# Patient Record
Sex: Female | Born: 1965 | Race: Black or African American | Hispanic: No | Marital: Married | Smoking: Never smoker
Health system: Southern US, Community
[De-identification: ages and names within clinical notes are randomized; demographics above are authoritative.]

## PROBLEM LIST (undated history)

## (undated) DIAGNOSIS — G43909 Migraine, unspecified, not intractable, without status migrainosus: Secondary | ICD-10-CM

## (undated) DIAGNOSIS — Z87442 Personal history of urinary calculi: Secondary | ICD-10-CM

## (undated) DIAGNOSIS — G971 Other reaction to spinal and lumbar puncture: Secondary | ICD-10-CM

## (undated) DIAGNOSIS — B019 Varicella without complication: Secondary | ICD-10-CM

## (undated) DIAGNOSIS — I10 Essential (primary) hypertension: Secondary | ICD-10-CM

## (undated) DIAGNOSIS — R011 Cardiac murmur, unspecified: Secondary | ICD-10-CM

## (undated) DIAGNOSIS — Z8489 Family history of other specified conditions: Secondary | ICD-10-CM

## (undated) HISTORY — PX: ENDOMETRIAL ABLATION: SHX621

## (undated) HISTORY — DX: Varicella without complication: B01.9

## (undated) HISTORY — PX: TUBAL LIGATION: SHX77

---

## 2004-02-01 DIAGNOSIS — Z87442 Personal history of urinary calculi: Secondary | ICD-10-CM

## 2004-02-01 HISTORY — DX: Personal history of urinary calculi: Z87.442

## 2004-02-26 ENCOUNTER — Emergency Department: Payer: Self-pay | Admitting: Emergency Medicine

## 2004-04-08 ENCOUNTER — Ambulatory Visit: Payer: Self-pay | Admitting: Specialist

## 2006-07-12 ENCOUNTER — Ambulatory Visit: Payer: Self-pay

## 2006-08-10 ENCOUNTER — Ambulatory Visit: Payer: Self-pay

## 2006-08-23 ENCOUNTER — Ambulatory Visit: Payer: Self-pay

## 2006-09-05 ENCOUNTER — Emergency Department: Payer: Self-pay | Admitting: Emergency Medicine

## 2007-10-11 ENCOUNTER — Ambulatory Visit: Payer: Self-pay

## 2008-01-13 IMAGING — CT CT STONE STUDY
1 of 2 series · 15 of 32 positions shown, 19 images · non-contrast
Comparison: none

REASON FOR EXAM: rlq pain, similar to kidney stone rm 5
COMMENTS:

[Series 2: stone · axial · 0.54mm/px · z∈[-836,-462]mm · 15 of 141 slices shown, 19 images]
[im 11/141  soft-tissue]
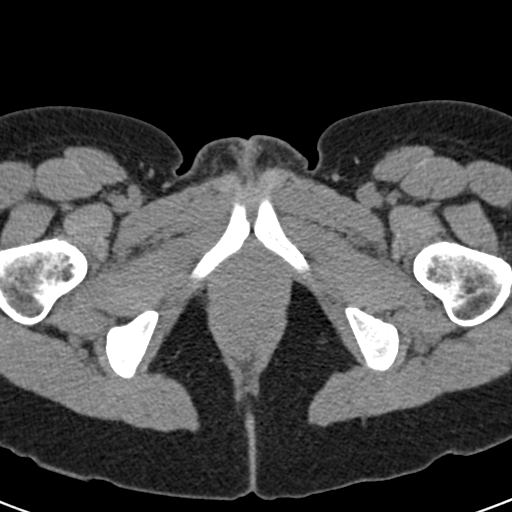
[im 11/141  bone]
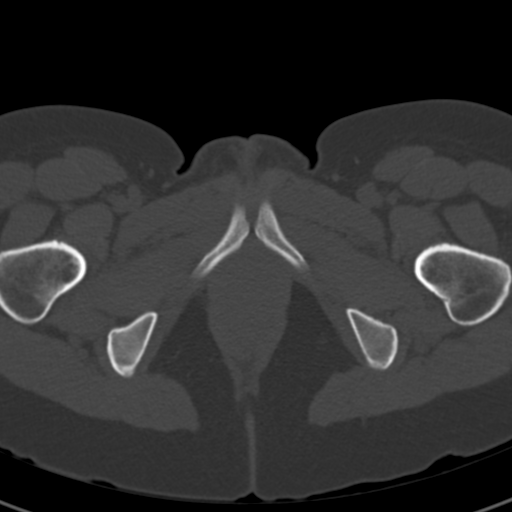
[im 21/141  soft-tissue]
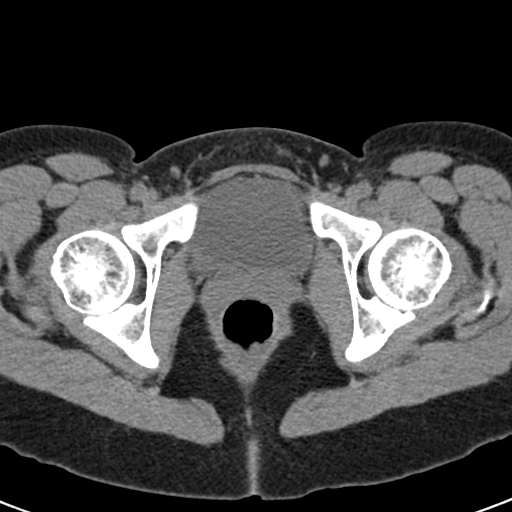
[im 31/141  soft-tissue]
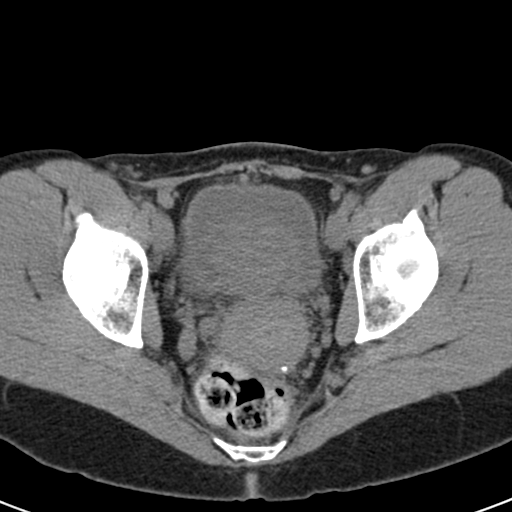
[im 41/141  soft-tissue]
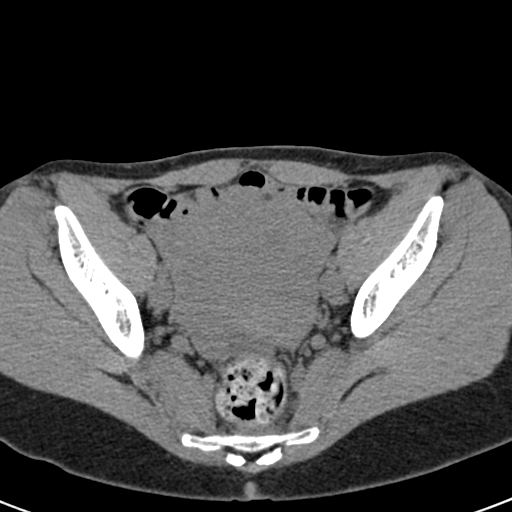
[im 51/141  soft-tissue]
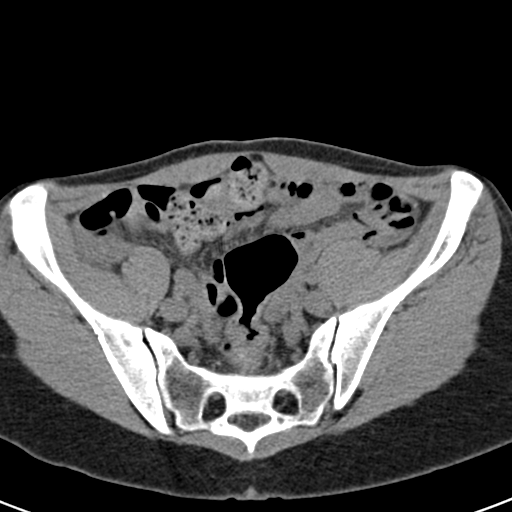
[im 61/141  soft-tissue]
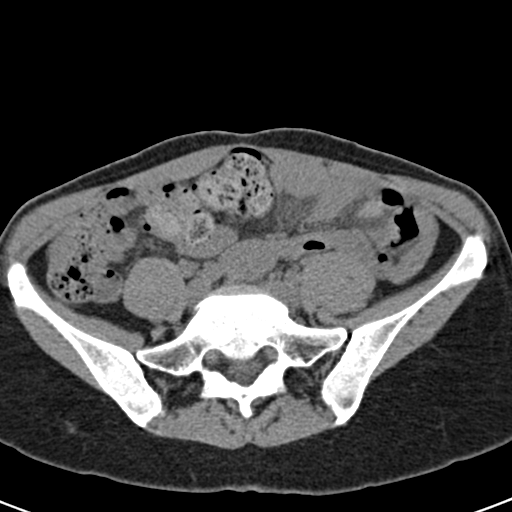
[im 71/141  soft-tissue]
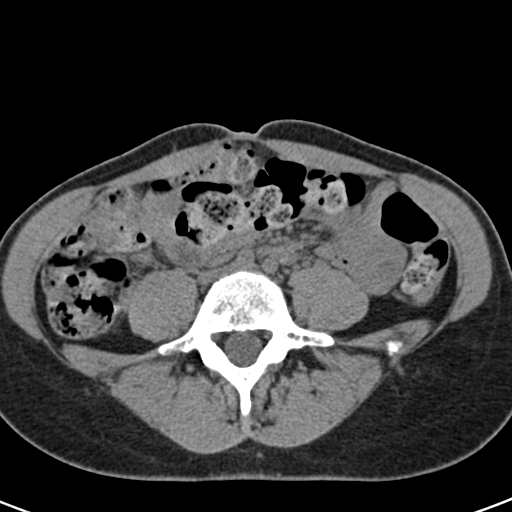
[im 81/141  soft-tissue]
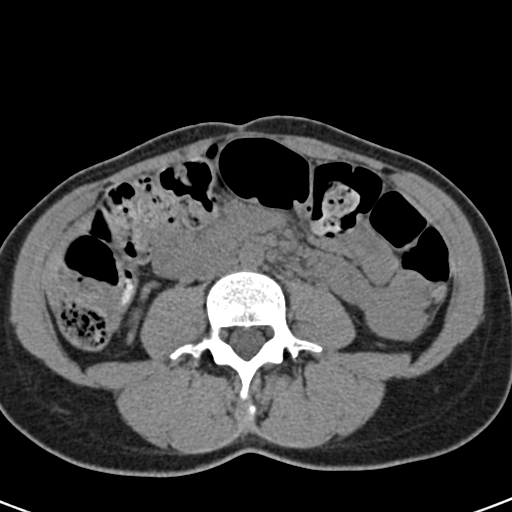
[im 91/141  soft-tissue]
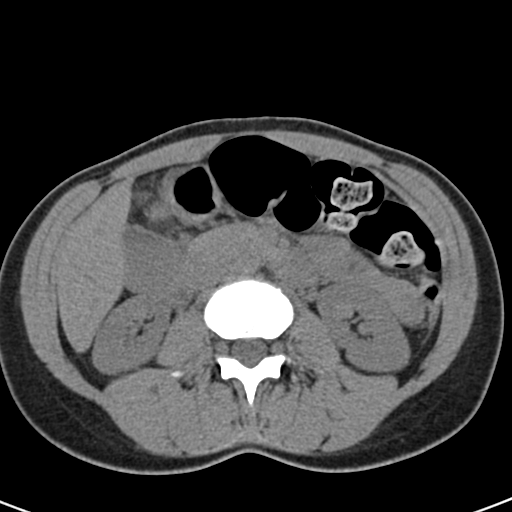
[im 91/141  bone]
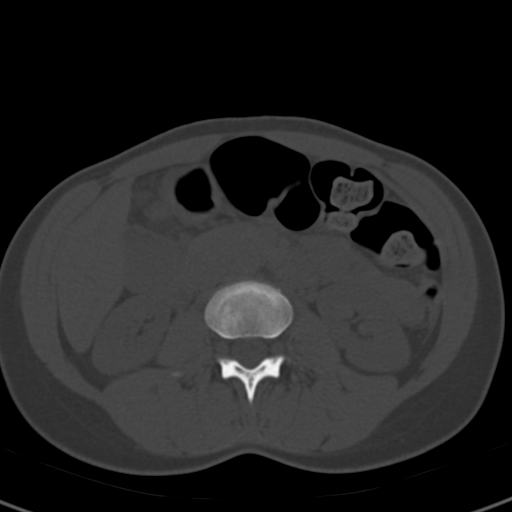
[im 101/141  soft-tissue]
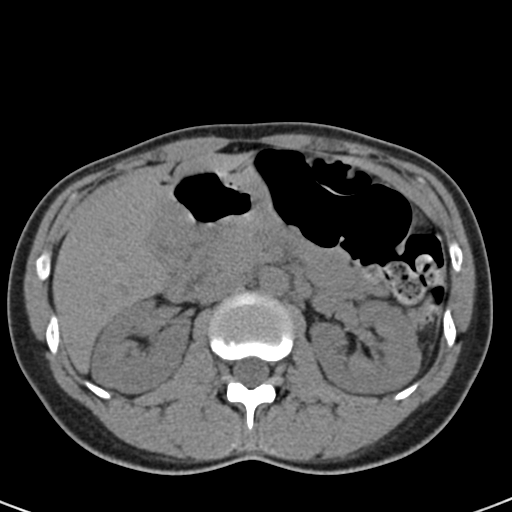
[im 111/141  soft-tissue]
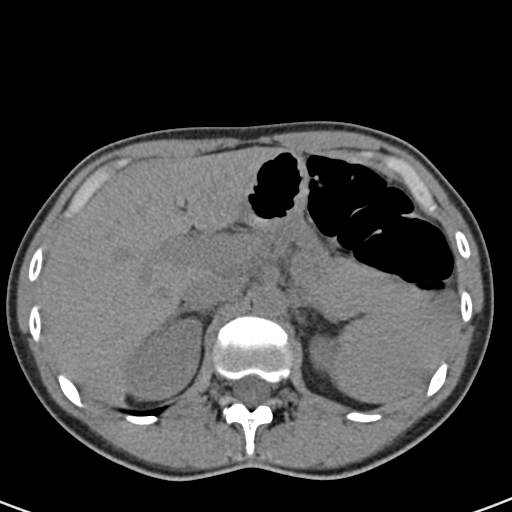
[im 121/141  soft-tissue]
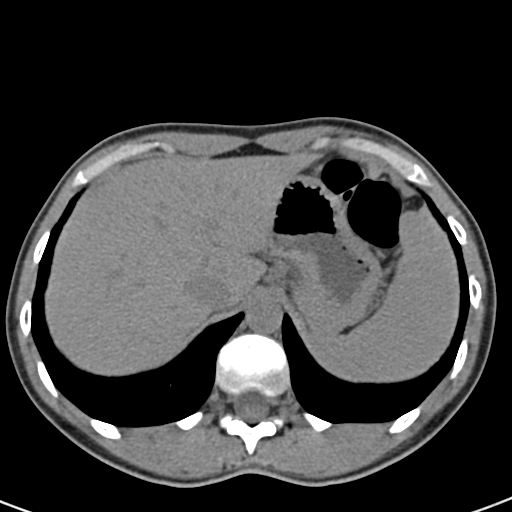
[im 121/141  lung]
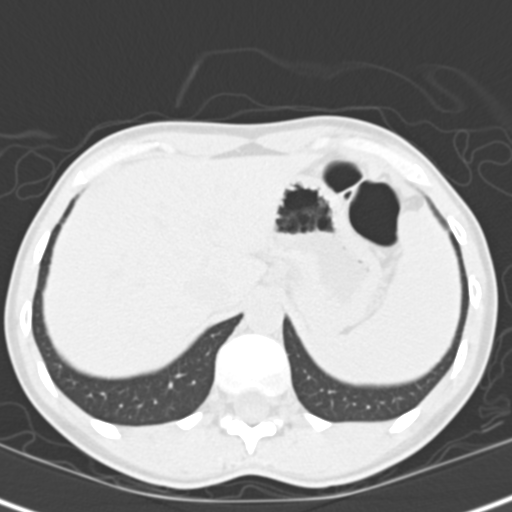
[im 126/141  lung]
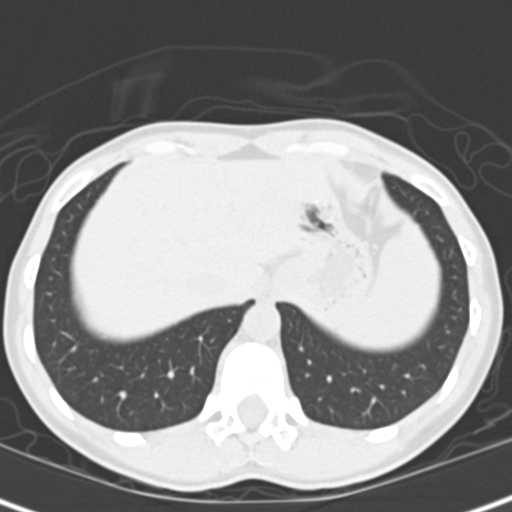
[im 131/141  soft-tissue]
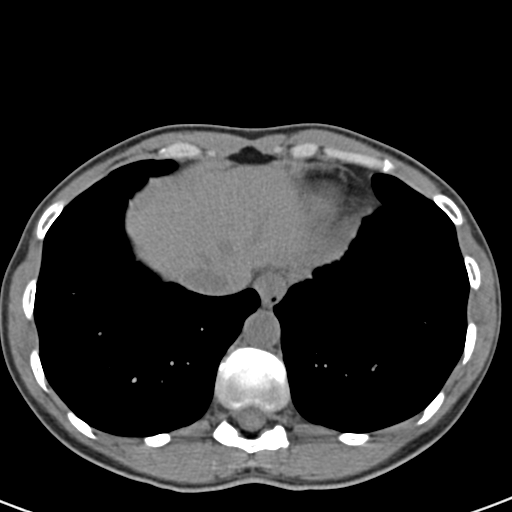
[im 131/141  lung]
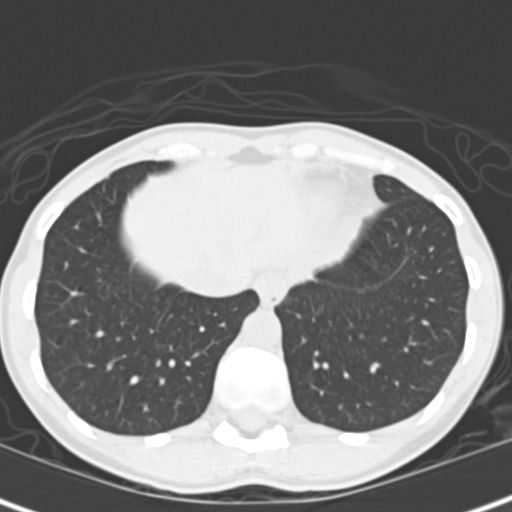
[im 136/141  lung]
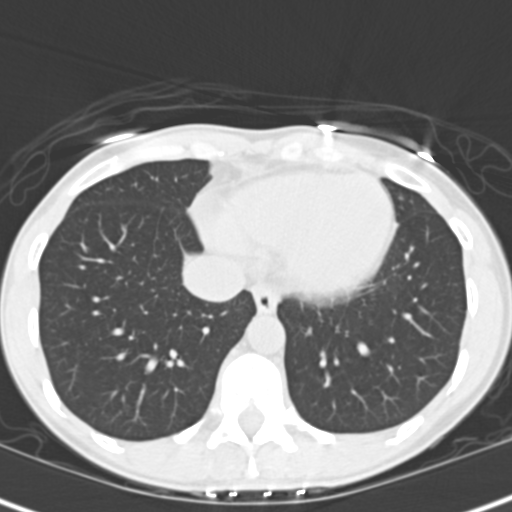

[15 of 32 positions shown; findings below may reference images not displayed]

PROCEDURE:     CT  - CT ABDOMEN /PELVIS WO (STONE)  - September 05, 2006  [DATE]

RESULT:     Noncontrast emergent CT of the abdomen and pelvis is performed.
The patient has no prior exam for comparison. The lung bases appear to be
normally aerated. There are noted focal calcific density changes in the
lower pole collecting system of the right kidney consistent with a lower
pole anterior calyx right renal stone best seen on image #53 and
demonstrating a diameter approximately 2.5 mm. There is no hydronephrosis.
No additional renal calculi are seen. There is low density laterally in the
midportion of the left kidney on image #40 which may represent a small cyst.
This can be further evaluated with ultrasound or triphasic CT.

Gallbladder appears to be distended. No radiopaque gallstones are evident.
No intrahepatic biliary ductal dilatation is evident.

The pancreas is poorly demonstrated but appears to be grossly normal. The
spleen is not enlarged. No oculi are evident within the ureters or bladder.
Phleboliths are seen in the pelvic region. The uterus appears to be mildly
enlarged. A small amount of cul-de-sac fluid present on the right. The
ovaries are not well demonstrated. There is no abnormal bowel distention.
There is no free air.
IMPRESSION: 1. No evidence of hydroureter or hydronephrosis. There is a nonobstructing
lower pole right renal stone.
2. Possible small amount of free fluid in the lower pelvic region in the
right side of the cul-de-sac.
3. The uterus appears to be mildly enlarged. Pelvic ultrasound can be
obtained for further investigation.
4. Ill-defined low density in the left mid kidney. Ultrasound of the kidneys
can be considered for further investigation as good a triphasic CT.

## 2008-05-01 ENCOUNTER — Ambulatory Visit: Payer: Self-pay | Admitting: Family Medicine

## 2008-06-25 DIAGNOSIS — I10 Essential (primary) hypertension: Secondary | ICD-10-CM | POA: Insufficient documentation

## 2008-06-25 DIAGNOSIS — D509 Iron deficiency anemia, unspecified: Secondary | ICD-10-CM | POA: Insufficient documentation

## 2008-09-15 DIAGNOSIS — B0089 Other herpesviral infection: Secondary | ICD-10-CM | POA: Insufficient documentation

## 2008-10-02 DIAGNOSIS — E7800 Pure hypercholesterolemia, unspecified: Secondary | ICD-10-CM | POA: Insufficient documentation

## 2008-10-21 ENCOUNTER — Ambulatory Visit: Payer: Self-pay | Admitting: Family Medicine

## 2009-03-27 DIAGNOSIS — R1013 Epigastric pain: Secondary | ICD-10-CM | POA: Insufficient documentation

## 2009-04-08 DIAGNOSIS — R519 Headache, unspecified: Secondary | ICD-10-CM | POA: Insufficient documentation

## 2009-04-11 ENCOUNTER — Emergency Department: Payer: Self-pay | Admitting: Emergency Medicine

## 2009-04-14 DIAGNOSIS — R5381 Other malaise: Secondary | ICD-10-CM | POA: Insufficient documentation

## 2009-04-15 DIAGNOSIS — E876 Hypokalemia: Secondary | ICD-10-CM | POA: Insufficient documentation

## 2009-09-08 IMAGING — CT CT HEAD WITHOUT AND WITH CONTRAST
1 of 2 series · 13 of 30 positions shown, 17 images · non-contrast
Comparison: none

REASON FOR EXAM: HA  dizziness  blood pressure high  eval tumor
COMMENTS:

[Series 2: without · axial · non-contrast · 0.43mm/px · z∈[-572,-452]mm · 13 of 30 slices shown, 17 images]
[im 3/30  brain]
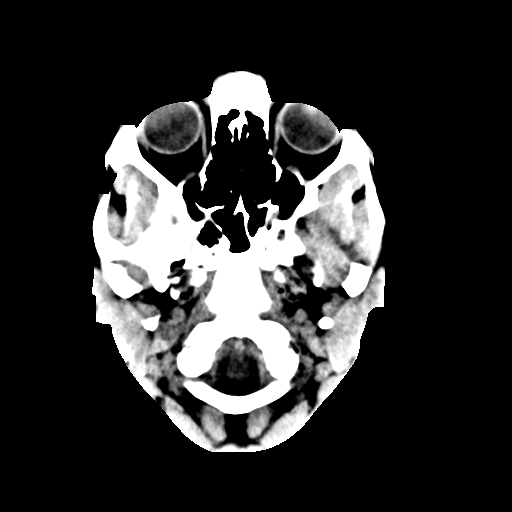
[im 3/30  bone]
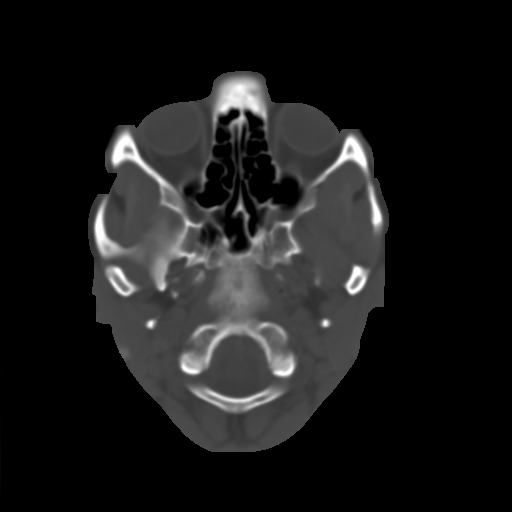
[im 5/30  brain]
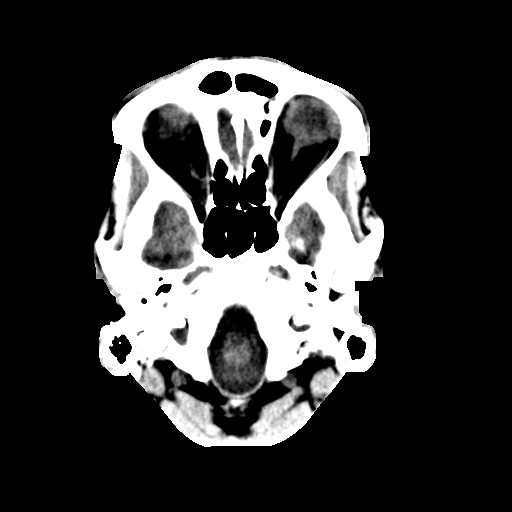
[im 7/30  brain]
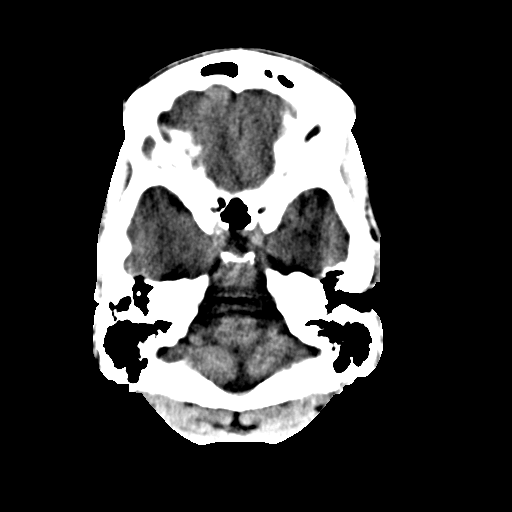
[im 9/30  brain]
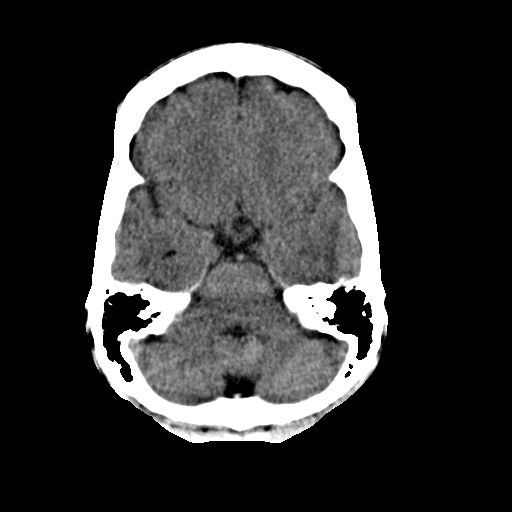
[im 11/30  brain]
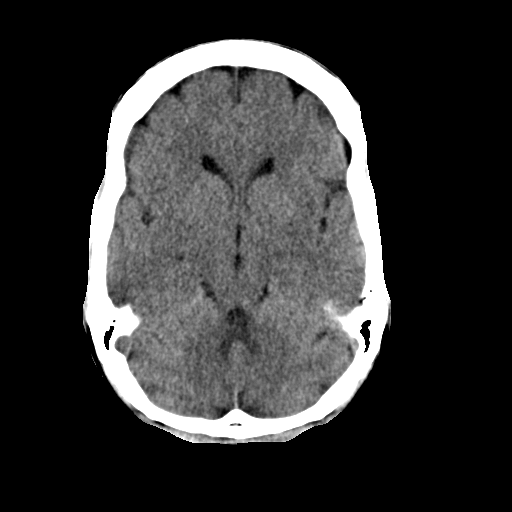
[im 11/30  bone]
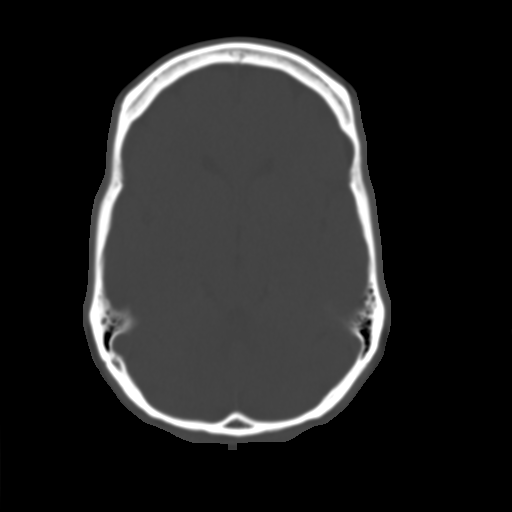
[im 13/30  brain]
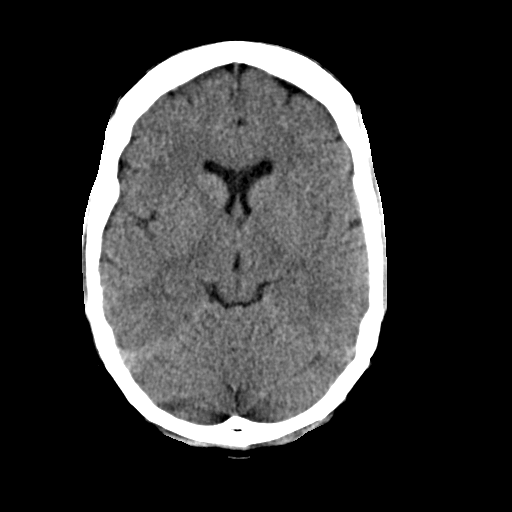
[im 15/30  brain]
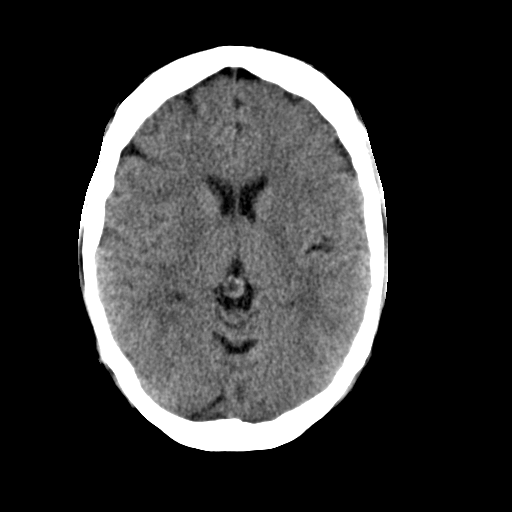
[im 17/30  brain]
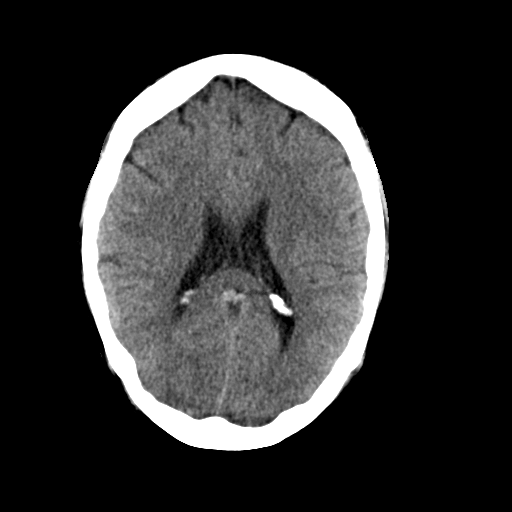
[im 19/30  brain]
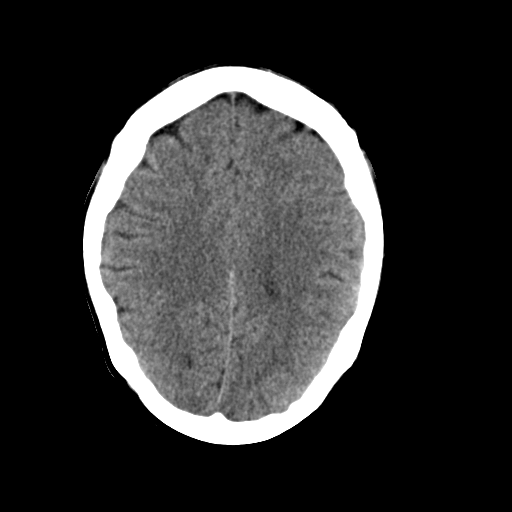
[im 19/30  bone]
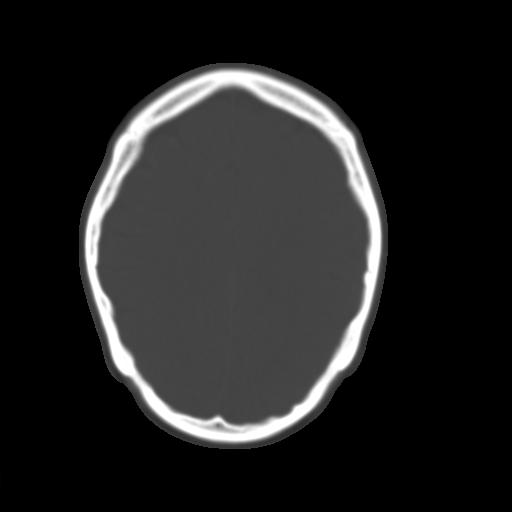
[im 21/30  brain]
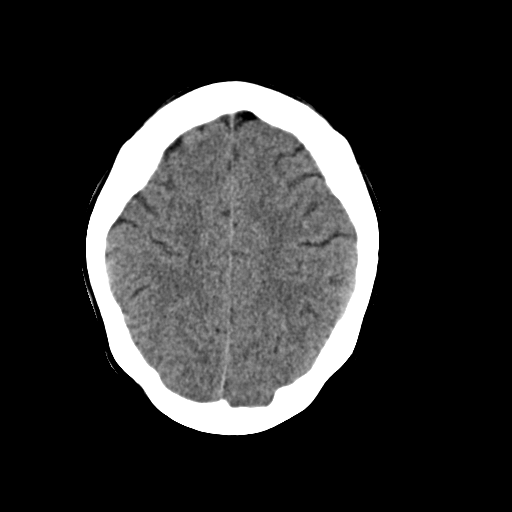
[im 23/30  brain]
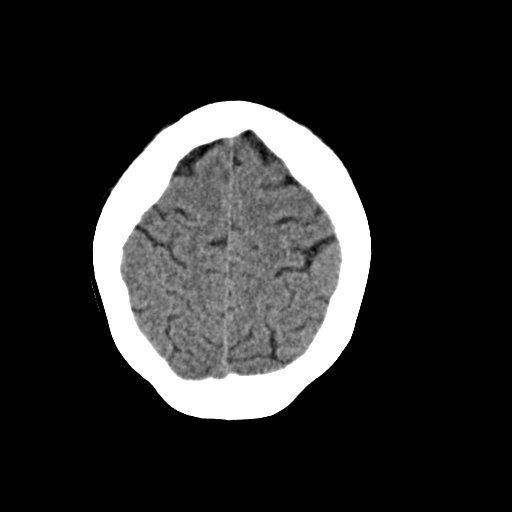
[im 25/30  brain]
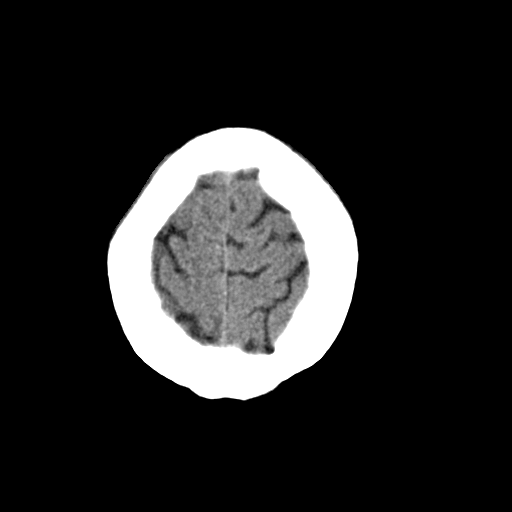
[im 27/30  brain]
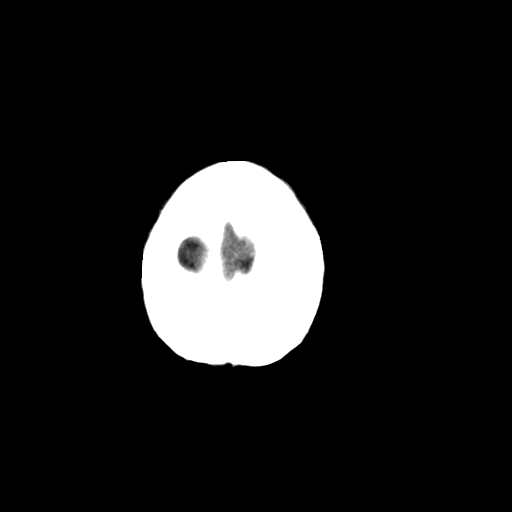
[im 27/30  bone]
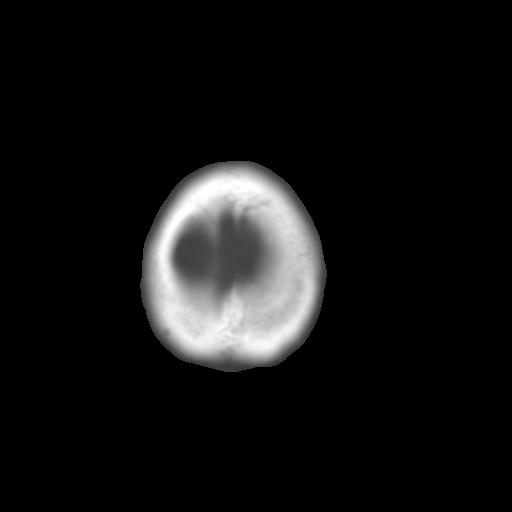

[13 of 30 positions shown; findings below may reference images not displayed]

PROCEDURE:     CT  - CT HEAD W/WO  - May 01, 2008  [DATE]

RESULT:     Axial noncontrast and contrast enhanced CT scanning was
performed through the brain at 5 mm intervals and slice thicknesses. The
ventricles are normal in size and position. There is no intracranial
hemorrhage nor evidence of intracranial mass effect. There are no findings
to suggest an acute evolving ischemic infarction.

Following contrast administration the enhancement pattern of the brain
parenchyma is normal. At bone window settings the observed portions of the
paranasal sinuses and mastoid air cells are clear.
IMPRESSION: I see no acute intracranial abnormality. If the patient's
symptoms persist and remain unexplained, followup MRI could be considered.

Of note is the fact that immediately following ingestion of IV contrast the
patient developed skin itching and macular erythematous areas over her chest
and back and lower neck. There was no difficulty breathing or swallowing.
The patient's mentation remain clear. The patient's vital signs were stable.
The patient received 25 mg of Benadryl IV. The patient was instructed to
return to the emergency department if she had additional symptoms to develop
immediately or in the next 24 hours. The patient voiced her willingness to
proceed. The patient was driven home by her son.

## 2009-11-11 ENCOUNTER — Ambulatory Visit: Payer: Self-pay | Admitting: Unknown Physician Specialty

## 2010-08-22 ENCOUNTER — Emergency Department: Payer: Self-pay | Admitting: Emergency Medicine

## 2010-12-20 ENCOUNTER — Ambulatory Visit: Payer: Self-pay | Admitting: Unknown Physician Specialty

## 2013-02-13 ENCOUNTER — Ambulatory Visit: Payer: Self-pay | Admitting: Otolaryngology

## 2013-04-02 ENCOUNTER — Ambulatory Visit: Payer: Self-pay | Admitting: Family Medicine

## 2013-10-24 ENCOUNTER — Ambulatory Visit: Payer: Self-pay | Admitting: Family Medicine

## 2014-07-07 ENCOUNTER — Other Ambulatory Visit: Payer: Self-pay | Admitting: Family Medicine

## 2014-07-07 ENCOUNTER — Telehealth: Payer: Self-pay | Admitting: Family Medicine

## 2014-07-07 DIAGNOSIS — S39012A Strain of muscle, fascia and tendon of lower back, initial encounter: Secondary | ICD-10-CM

## 2014-07-07 MED ORDER — CYCLOBENZAPRINE HCL 5 MG PO TABS: 5.0000 mg | ORAL_TABLET | Freq: Three times a day (TID) | ORAL | Status: DC | PRN

## 2014-07-07 MED ORDER — MELOXICAM 15 MG PO TABS: 15.0000 mg | ORAL_TABLET | Freq: Every day | ORAL | Status: DC

## 2014-07-07 MED ORDER — MELOXICAM 15 MG PO TABS
15.0000 mg | ORAL_TABLET | Freq: Every day | ORAL | Status: DC
Start: 2014-07-07 — End: 2014-07-07

## 2014-07-07 NOTE — Telephone Encounter (Signed)
Pt stated her right shoulder has been hurting since Wednesday 07/02/14 and wanted to speak with you about coming in for OV or getting a referral. I advised pt I could get her in with a PA today or you on Friday. Pt stated that she would like to speak with you and get your advise. Thanks TNP

## 2014-07-07 NOTE — Telephone Encounter (Signed)
Spoke with patient. Is having back pain.  Is taking daily medication, icy hot and heat.  Continual pain in shoulder area, right upper back.   No acute trauma that she know of.  Was out of town last week.  Will send in rx to pharmacy.

## 2014-09-25 ENCOUNTER — Other Ambulatory Visit: Payer: Self-pay | Admitting: Family Medicine

## 2014-09-25 DIAGNOSIS — E559 Vitamin D deficiency, unspecified: Secondary | ICD-10-CM

## 2014-09-25 NOTE — Telephone Encounter (Signed)
Last OV 06/2014  Thanks,   -Laura  

## 2014-09-26 ENCOUNTER — Encounter: Payer: Self-pay | Admitting: *Deleted

## 2014-09-26 ENCOUNTER — Emergency Department
Admission: EM | Admit: 2014-09-26 | Discharge: 2014-09-26 | Disposition: A | Discharge status: Home / Self Care | Payer: BLUE CROSS/BLUE SHIELD | Attending: Emergency Medicine | Admitting: Emergency Medicine

## 2014-09-26 DIAGNOSIS — Z79899 Other long term (current) drug therapy: Secondary | ICD-10-CM | POA: Insufficient documentation

## 2014-09-26 DIAGNOSIS — I10 Essential (primary) hypertension: Secondary | ICD-10-CM | POA: Insufficient documentation

## 2014-09-26 DIAGNOSIS — G43009 Migraine without aura, not intractable, without status migrainosus: Secondary | ICD-10-CM

## 2014-09-26 DIAGNOSIS — G43909 Migraine, unspecified, not intractable, without status migrainosus: Secondary | ICD-10-CM | POA: Diagnosis present

## 2014-09-26 DIAGNOSIS — Z791 Long term (current) use of non-steroidal anti-inflammatories (NSAID): Secondary | ICD-10-CM | POA: Insufficient documentation

## 2014-09-26 HISTORY — DX: Essential (primary) hypertension: I10

## 2014-09-26 HISTORY — DX: Migraine, unspecified, not intractable, without status migrainosus: G43.909

## 2014-09-26 MED ORDER — DEXAMETHASONE SODIUM PHOSPHATE 10 MG/ML IJ SOLN
10.0000 mg | Freq: Once | INTRAMUSCULAR | Status: AC
  Administered 2014-09-26: 10 mg via INTRAVENOUS
  Filled 2014-09-26: qty 1

## 2014-09-26 MED ORDER — DIPHENHYDRAMINE HCL 50 MG/ML IJ SOLN
25.0000 mg | Freq: Once | INTRAMUSCULAR | Status: AC
  Administered 2014-09-26: 25 mg via INTRAVENOUS
  Filled 2014-09-26: qty 1

## 2014-09-26 MED ORDER — METOCLOPRAMIDE HCL 5 MG/ML IJ SOLN
10.0000 mg | Freq: Once | INTRAMUSCULAR | Status: AC
  Administered 2014-09-26: 10 mg via INTRAVENOUS
  Filled 2014-09-26: qty 2

## 2014-09-26 MED ORDER — KETOROLAC TROMETHAMINE 30 MG/ML IJ SOLN
30.0000 mg | Freq: Once | INTRAMUSCULAR | Status: AC
Start: 2014-09-26 — End: 2014-09-26
  Administered 2014-09-26: 30 mg via INTRAVENOUS
  Filled 2014-09-26: qty 1

## 2014-09-26 MED ORDER — SODIUM CHLORIDE 0.9 % IV BOLUS (SEPSIS)
500.0000 mL | INTRAVENOUS | Status: AC
  Administered 2014-09-26: 500 mL via INTRAVENOUS

## 2014-09-26 NOTE — Discharge Instructions (Signed)
You have been seen in the Emergency Department (ED) for a migraine.  Please use Tylenol or Motrin as needed for symptoms, but only as written on the box, and take any regular medications that have been prescribed for you. °As we have discussed, please follow up with your doctor as soon as possible regarding today’s ED visit and your headache symptoms.   ° °Call your doctor or return to the Emergency Department (ED) if you have a worsening headache, sudden and severe headache, confusion, slurred speech, facial droop, weakness or numbness in any arm or leg, extreme fatigue, or other symptoms that concern you. ° ° °Migraine Headache °A migraine headache is an intense, throbbing pain on one or both sides of your head. A migraine can last for 30 minutes to several hours. °CAUSES  °The exact cause of a migraine headache is not always known. However, a migraine may be caused when nerves in the brain become irritated and release chemicals that cause inflammation. This causes pain. °Certain things may also trigger migraines, such as: °· Alcohol. °· Smoking. °· Stress. °· Menstruation. °· Aged cheeses. °· Foods or drinks that contain nitrates, glutamate, aspartame, or tyramine. °· Lack of sleep. °· Chocolate. °· Caffeine. °· Hunger. °· Physical exertion. °· Fatigue. °· Medicines used to treat chest pain (nitroglycerine), birth control pills, estrogen, and some blood pressure medicines. °SIGNS AND SYMPTOMS °· Pain on one or both sides of your head. °· Pulsating or throbbing pain. °· Severe pain that prevents daily activities. °· Pain that is aggravated by any physical activity. °· Nausea, vomiting, or both. °· Dizziness. °· Pain with exposure to bright lights, loud noises, or activity. °· General sensitivity to bright lights, loud noises, or smells. °Before you get a migraine, you may get warning signs that a migraine is coming (aura). An aura may include: °· Seeing flashing lights. °· Seeing bright spots, halos, or zigzag  lines. °· Having tunnel vision or blurred vision. °· Having feelings of numbness or tingling. °· Having trouble talking. °· Having muscle weakness. °DIAGNOSIS  °A migraine headache is often diagnosed based on: °· Symptoms. °· Physical exam. °· A CT scan or MRI of your head. These imaging tests cannot diagnose migraines, but they can help rule out other causes of headaches. °TREATMENT °Medicines may be given for pain and nausea. Medicines can also be given to help prevent recurrent migraines.  °HOME CARE INSTRUCTIONS °· Only take over-the-counter or prescription medicines for pain or discomfort as directed by your health care provider. The use of long-term narcotics is not recommended. °· Lie down in a dark, quiet room when you have a migraine. °· Keep a journal to find out what may trigger your migraine headaches. For example, write down: °¨ What you eat and drink. °¨ How much sleep you get. °¨ Any change to your diet or medicines. °· Limit alcohol consumption. °· Quit smoking if you smoke. °· Get 7-9 hours of sleep, or as recommended by your health care provider. °· Limit stress. °· Keep lights dim if bright lights bother you and make your migraines worse. °SEEK IMMEDIATE MEDICAL CARE IF:  °· Your migraine becomes severe. °· You have a fever. °· You have a stiff neck. °· You have vision loss. °· You have muscular weakness or loss of muscle control. °· You start losing your balance or have trouble walking. °· You feel faint or pass out. °· You have severe symptoms that are different from your first symptoms. °MAKE SURE YOU:  °·   Understand these instructions. °· Will watch your condition. °· Will get help right away if you are not doing well or get worse. °Document Released: 01/17/2005 Document Revised: 06/03/2013 Document Reviewed: 09/24/2012 °[employer]® Patient Information ©2015 [employer]. This information is not intended to replace advice given to you by your health care provider. Make sure you discuss any  questions you have with your health care provider. ° °

## 2014-09-26 NOTE — ED Provider Notes (Signed)
[facility] Emergency Department Provider Note  ____________________________________________  Time seen: Approximately 9:55 PM  I have reviewed the triage vital signs and the nursing notes.   HISTORY  Chief Complaint Migraine    HPI Emily Hurst is a 49 y.o. female with a history of migraine and hypertension but he does not take chronic medication for either presents with 3 days of headache.  She states it feels similar to her prior migraines but is lasting longer than usual.  It was gradual in onset but has been persistent for 3 days.  Her migraines been well-controlled for years so this is somewhat atypical.  She does not take medications such as Imitrex or other migraine medicine, but usually Excedrin works for headaches.  It is not been working for the last couple of days.  She has been having nausea, no vomiting, no visual changes, no chest pain, no shortness of breath, no abdominal pain, no dysuria.   Past Medical History  Diagnosis Date  . Migraine   . Hypertension     Patient Active Problem List   Diagnosis Date Noted  . Vitamin D deficiency 09/25/2014    Past Surgical History  Procedure Laterality Date  . Cesarean section    . Endometrial ablation    . Tubal ligation      Current Outpatient Rx  Name  Route  Sig  Dispense  Refill  . Vitamin D, Ergocalciferol, (DRISDOL) 50000 UNITS CAPS capsule   Oral   Take 1 capsule (50,000 Units total) by mouth every 30 (thirty) days.   3 capsule   3   . cyclobenzaprine (FLEXERIL) 5 MG tablet   Oral   Take 1 tablet (5 mg total) by mouth 3 (three) times daily as needed for muscle spasms.   30 tablet   0   . meloxicam (MOBIC) 15 MG tablet   Oral   Take 1 tablet (15 mg total) by mouth daily.   30 tablet   0     Allergies Biaxin; Contrast media; and Percocet  History reviewed. No pertinent family history.  Social History Social History  Substance Use Topics  . Smoking status: Never  Smoker   . Smokeless tobacco: None  . Alcohol Use: No    Review of Systems Constitutional: No fever/chills Eyes: No visual changes. ENT: No sore throat. Cardiovascular: Denies chest pain. Respiratory: Denies shortness of breath. Gastrointestinal: No abdominal pain.  nausea, no vomiting.  No diarrhea.  No constipation. Genitourinary: Negative for dysuria. Musculoskeletal: Negative for back pain. Skin: Negative for rash. Neurological: Severe headache mostly on the top of her head, no focal weakness or numbness.  10-point ROS otherwise negative.  ____________________________________________   PHYSICAL EXAM:  VITAL SIGNS: ED Triage Vitals  Enc Vitals Group     BP 09/26/14 2117 170/100 mmHg     Pulse Rate 09/26/14 2117 102     Resp 09/26/14 2117 18     Temp 09/26/14 2117 98.8 F (37.1 C)     Temp Source 09/26/14 2117 Oral     SpO2 09/26/14 2117 100 %     Weight 09/26/14 2117 128 lb (58.06 kg)     Height 09/26/14 2117 5' 6.5" (1.689 m)     Head Cir --      Peak Flow --      Pain Score 09/26/14 2118 8     Pain Loc --      Pain Edu? --      Excl. in GC? --  Constitutional: Alert and oriented. Well appearing and in no acute distress.  Appears uncomfortable but definitely nontoxic Eyes: Conjunctivae are normal. PERRL. EOMI. no papilledema on funduscopic exam Head: Atraumatic. Nose: No congestion/rhinnorhea. Mouth/Throat: Mucous membranes are moist.  Oropharynx non-erythematous. Neck: No stridor.   Cardiovascular: Normal rate, regular rhythm. Grossly normal heart sounds.  Good peripheral circulation. Respiratory: Normal respiratory effort.  No retractions. Lungs CTAB. Gastrointestinal: Soft and nontender. No distention. No abdominal bruits. No CVA tenderness. Musculoskeletal: No lower extremity tenderness nor edema.  No joint effusions. Neurologic:  Normal speech and language. No gross focal neurologic deficits are appreciated.  Skin:  Skin is warm, dry and intact. No  rash noted. Psychiatric: Mood and affect are normal. Speech and behavior are normal.  ____________________________________________   LABS (all labs ordered are listed, but only abnormal results are displayed)  Not indicated ____________________________________________  EKG  Not indicated ____________________________________________  RADIOLOGY  Not indicated  ____________________________________________   PROCEDURES  Procedure(s) performed: None  Critical Care performed: No ____________________________________________   INITIAL IMPRESSION / ASSESSMENT AND PLAN / ED COURSE  Pertinent labs & imaging results that were available during my care of the patient were reviewed by me and considered in my medical decision making (see chart for details).  The patient is well-appearing even though she obviously is uncomfortable from her headache.  I have no concern for papilledema based on her reassuring funduscopic exam, her history, and her demographics and body habitus.  Intracranial abnormality such as an aneurysm is possible but highly unlikely given the gradual onset of her pain, its persistence, and lack of any other supporting symptoms.  I will treat her empirically for her migraine and anticipate this will significantly relieve her discomfort.  She has a primary care doctor with whom she can follow up early next week.  I am giving her my usual cocktail of 500 mL normal saline bolus, Toradol 30 mg IV, Reglan 10 mg IV, Benadryl 25 mg IV, and Decadron 10 mg IV to help prevent recurrence.  ----------------------------------------- 11:01 PM on 09/26/2014 -----------------------------------------  The patient is receiving treatment now.  I am transferring care to Dr. Manson Passey to reassess her after treatment.  ____________________________________________  FINAL CLINICAL IMPRESSION(S) / ED DIAGNOSES  Final diagnoses:  Migraine without aura and without status migrainosus, not  intractable      NEW MEDICATIONS STARTED DURING THIS VISIT:  New Prescriptions   No medications on file     Loleta Rose, MD 09/26/14 2301

## 2014-09-26 NOTE — ED Notes (Signed)
Pt reports HA since Monday, feels similar to pt's previous migraines.  PT reports throbbing to entire head, nausea, and sensitivity to light.  Pt reports elevated BP but not currently on BP meds, pt reports not on meds b/c bp fluctuates.  Pt NAD at this time.

## 2014-09-26 NOTE — ED Notes (Signed)
Pt admits to migraine HA that began on Monday - pt has taken Excedrin w/o relief. Pt also admits to nausea, denies vomiting, as well as some photophobia.

## 2014-09-27 NOTE — ED Provider Notes (Signed)
I assumed care of the patient 11:00 PM from Dr. York Cerise. Patient states that headache is completely resolved following Dr. York Cerise. As such patient was discharged home  Darci Current, MD 09/27/14 [phone]

## 2014-09-29 ENCOUNTER — Telehealth: Payer: Self-pay | Admitting: Family Medicine

## 2014-09-29 ENCOUNTER — Ambulatory Visit (INDEPENDENT_AMBULATORY_CARE_PROVIDER_SITE_OTHER): Payer: BLUE CROSS/BLUE SHIELD | Admitting: Family Medicine

## 2014-09-29 ENCOUNTER — Encounter: Payer: Self-pay | Admitting: Family Medicine

## 2014-09-29 VITALS — BP 140/100 | HR 72 | Temp 98.7°F | Resp 16 | Ht 65.5 in | Wt 128.0 lb

## 2014-09-29 DIAGNOSIS — I10 Essential (primary) hypertension: Secondary | ICD-10-CM | POA: Diagnosis not present

## 2014-09-29 DIAGNOSIS — G43909 Migraine, unspecified, not intractable, without status migrainosus: Secondary | ICD-10-CM | POA: Diagnosis not present

## 2014-09-29 DIAGNOSIS — E28319 Asymptomatic premature menopause: Secondary | ICD-10-CM | POA: Insufficient documentation

## 2014-09-29 DIAGNOSIS — R000 Tachycardia, unspecified: Secondary | ICD-10-CM | POA: Insufficient documentation

## 2014-09-29 DIAGNOSIS — E860 Dehydration: Secondary | ICD-10-CM | POA: Insufficient documentation

## 2014-09-29 DIAGNOSIS — R1031 Right lower quadrant pain: Secondary | ICD-10-CM | POA: Insufficient documentation

## 2014-09-29 DIAGNOSIS — R5383 Other fatigue: Secondary | ICD-10-CM | POA: Insufficient documentation

## 2014-09-29 DIAGNOSIS — N920 Excessive and frequent menstruation with regular cycle: Secondary | ICD-10-CM | POA: Insufficient documentation

## 2014-09-29 MED ORDER — HYDROCHLOROTHIAZIDE 12.5 MG PO TABS: 12.5000 mg | ORAL_TABLET | Freq: Every day | ORAL | Status: DC

## 2014-09-29 NOTE — Telephone Encounter (Signed)
Pt was discharged from [facility] ER 09/26/2014 for headache, being light headed and high blood pressure.  I have schedule pt today for a follow up/MW

## 2014-09-29 NOTE — Progress Notes (Signed)
Patient ID: Emily Hurst, female   DOB: December 12, 1965, 49 y.o.   MRN: [id number]       [name] Female    DOB: 1965/08/08   49 y.o.   MRN: [id number] Visit Date: 09/29/2014  Today's Provider: Lorie Phenix, MD   Chief Complaint  Patient presents with  . Hospitalization Follow-up  . Hypertension   Subjective:    HPI  ER Follow up  Patient was seen at [facility] ER on Friday 09/26/2014. She was treated for headache and elevated BP. Per pt BP188/106 at arrival and 148/96 at discharge. Treatment for this included migraine cocktail at ER. She reports good tolerance of treatment. She reports that headache has Improved. Patient reports that she is still feeling some lightheaded, tired and poor appetite. Patient reports that she started with headache on Monday 09/22/2014, pt took Excedrin as needed.    Hypertension, follow-up:    She was last seen for hypertension 3 months ago.  BP at that visit was 138/98. Management changes since that visit include none. She reports good compliance with treatment. She is exercising. She is adherent to low salt diet.   Outside blood pressures are 138/106 yesterday at [facility]. She is experiencing fatigue.  Patient denies chest pain.   Cardiovascular risk factors include none.  Use of agents associated with hypertension: none.      Patient reports that she has been checking her BP at [facility] every other week since May, 2016 Patient reports average 127/86. That is about the average. Yesterday 138/106.   Weight trend: stable Wt Readings from Last 3 Encounters:  09/29/14 128 lb (58.06 kg)  09/26/14 128 lb (58.06 kg)    Current diet: well balanced       Allergies  Allergen Reactions  . Biaxin [Clarithromycin]   . Contrast Media [Iodinated Diagnostic Agents]   . Percocet [Oxycodone-Acetaminophen]    Previous Medications   ASPIRIN-ACETAMINOPHEN-CAFFEINE (EXCEDRIN MIGRAINE) 250-250-65 MG PER TABLET    Take 1 tablet by mouth. AS  NEEDED   CYCLOBENZAPRINE (FLEXERIL) 5 MG TABLET    Take 1 tablet (5 mg total) by mouth 3 (three) times daily as needed for muscle spasms.   MULTIPLE VITAMINS-MINERALS (WOMENS MULTIVITAMIN PLUS) TABS    Take 1 tablet by mouth daily.   VITAMIN D, ERGOCALCIFEROL, (DRISDOL) 50000 UNITS CAPS CAPSULE    Take 1 capsule (50,000 Units total) by mouth every 30 (thirty) days.    Review of Systems  Constitutional: Positive for activity change and fatigue.  Neurological: Positive for light-headedness and headaches.    Social History  Substance Use Topics  . Smoking status: Never Smoker   . Smokeless tobacco: Never Used  . Alcohol Use: No   Objective:   BP 140/100 mmHg  Pulse 72  Temp(Src) 98.7 F (37.1 C) (Oral)  Resp 16  Ht 5' 5.5" (1.664 m)  Wt 128 lb (58.06 kg)  BMI 20.97 kg/m2  SpO2 98%  Physical Exam  Constitutional: She is oriented to person, place, and time. She appears well-developed and well-nourished.  Cardiovascular: Normal rate and regular rhythm.   Pulmonary/Chest: Effort normal and breath sounds normal.  Neurological: She is alert and oriented to person, place, and time.  Psychiatric: She has a normal mood and affect. Her behavior is normal. Thought content normal.      Assessment & Plan:     1. Essential (primary) hypertension Condition is worsening. Will start medication for better control.  Recheck in 4 to 6 weeks.  - CBC  with Differential/Platelet - Comprehensive metabolic panel - hydrochlorothiazide (HYDRODIURIL) 12.5 MG tablet; Take 1 tablet (12.5 mg total) by mouth daily.  Dispense: 90 tablet; Refill: 3  2. Migraine without status migrainosus, not intractable, unspecified migraine type Will consider prophylaxis if worsens.      Lorie Phenix, MD  [facility]   ------------------------------------------------------------------------------------

## 2014-09-30 ENCOUNTER — Telehealth: Payer: Self-pay

## 2014-09-30 LAB — CBC WITH DIFFERENTIAL/PLATELET
Basophils Absolute: 0 10*3/uL (ref 0.0–0.2)
Basos: 1 %
EOS (ABSOLUTE): 0.1 10*3/uL (ref 0.0–0.4)
EOS: 1 %
Hematocrit: 42.6 % (ref 34.0–46.6)
Hemoglobin: 14.8 g/dL (ref 11.1–15.9)
IMMATURE GRANULOCYTES: 0 %
Immature Grans (Abs): 0 10*3/uL (ref 0.0–0.1)
Lymphocytes Absolute: 1.7 10*3/uL (ref 0.7–3.1)
Lymphs: 24 %
MCH: 32.2 pg (ref 26.6–33.0)
MCHC: 34.7 g/dL (ref 31.5–35.7)
MCV: 93 fL (ref 79–97)
MONOS ABS: 0.4 10*3/uL (ref 0.1–0.9)
Monocytes: 6 %
NEUTROS PCT: 68 %
Neutrophils Absolute: 4.8 10*3/uL (ref 1.4–7.0)
PLATELETS: 291 10*3/uL (ref 150–379)
RBC: 4.6 x10E6/uL (ref 3.77–5.28)
RDW: 13.6 % (ref 12.3–15.4)
WBC: 7 10*3/uL (ref 3.4–10.8)

## 2014-09-30 LAB — COMPREHENSIVE METABOLIC PANEL
ALT: 12 IU/L (ref 0–32)
AST: 21 IU/L (ref 0–40)
Albumin/Globulin Ratio: 1.6 (ref 1.1–2.5)
Albumin: 5 g/dL (ref 3.5–5.5)
Alkaline Phosphatase: 65 IU/L (ref 39–117)
BUN/Creatinine Ratio: 18 (ref 9–23)
BUN: 18 mg/dL (ref 6–24)
Bilirubin Total: 0.8 mg/dL (ref 0.0–1.2)
CALCIUM: 9.7 mg/dL (ref 8.7–10.2)
CO2: 19 mmol/L (ref 18–29)
CREATININE: 1.01 mg/dL — AB (ref 0.57–1.00)
Chloride: 98 mmol/L (ref 97–108)
GFR calc Af Amer: 76 mL/min/{1.73_m2} (ref >59)
GFR, EST NON AFRICAN AMERICAN: 66 mL/min/{1.73_m2} (ref >59)
GLUCOSE: 93 mg/dL (ref 65–99)
Globulin, Total: 3.1 g/dL (ref 1.5–4.5)
Potassium: 4.3 mmol/L (ref 3.5–5.2)
Sodium: 140 mmol/L (ref 134–144)
Total Protein: 8.1 g/dL (ref 6.0–8.5)

## 2014-09-30 NOTE — Telephone Encounter (Signed)
Pt advised.   Thanks,   -Connor Foxworthy  

## 2014-09-30 NOTE — Telephone Encounter (Signed)
-----   Message from Lorie Phenix, MD sent at 09/30/2014  8:27 AM EDT ----- Labs are all within normal limits.  Thanks.

## 2014-10-28 ENCOUNTER — Ambulatory Visit (INDEPENDENT_AMBULATORY_CARE_PROVIDER_SITE_OTHER): Payer: BLUE CROSS/BLUE SHIELD | Admitting: Family Medicine

## 2014-10-28 ENCOUNTER — Encounter: Payer: Self-pay | Admitting: Family Medicine

## 2014-10-28 VITALS — BP 120/86 | HR 76 | Temp 98.7°F | Resp 16 | Ht 66.5 in | Wt 129.0 lb

## 2014-10-28 DIAGNOSIS — Z1231 Encounter for screening mammogram for malignant neoplasm of breast: Secondary | ICD-10-CM

## 2014-10-28 DIAGNOSIS — G43909 Migraine, unspecified, not intractable, without status migrainosus: Secondary | ICD-10-CM

## 2014-10-28 DIAGNOSIS — R030 Elevated blood-pressure reading, without diagnosis of hypertension: Secondary | ICD-10-CM | POA: Diagnosis not present

## 2014-10-28 NOTE — Progress Notes (Signed)
Patient ID: Emily Hurst, female   DOB: [birth year], 49 y.o.   MRN: [id number]        [name] Female    DOB: October 27, 1965   49 y.o.   MRN: [id number] Visit Date: 10/28/2014  Today's Provider: Lorie Phenix, MD   Chief Complaint  Patient presents with  . Hypertension  . Headache   Subjective:    HPI   Hypertension, follow-up:  Patient reports that she only took 2 days of HCTZ due to low blood pressure readings. Is not taking it now.    BP Readings from Last 3 Encounters:  10/28/14 120/86  09/29/14 140/100  09/26/14 144/98    She was last seen for hypertension 4 weeks ago.  BP at that visit was 140/100. Management changes since that visit include none. She reports fair compliance with treatment. She is having side effects. Low blood pressure, pt reports BP 113/75  She is not exercising. She is adherent to low salt diet.   Outside blood pressures are 112-143/871-95. She is experiencing none.  Patient denies chest pain.   Cardiovascular risk factors include none.  Use of agents associated with hypertension: NSAIDS.     Weight trend: stable Wt Readings from Last 3 Encounters:  10/28/14 129 lb (58.514 kg)  09/29/14 128 lb (58.06 kg)  09/26/14 128 lb (58.06 kg)    Current diet: in general, a "healthy" diet    Does try to avoid salt and unhealthy foods.       Follow up for Migrain  The patient was last seen for this 4 weeks ago. Changes made at last visit include D/C excedrin.  She reports excellent compliance with treatment. She feels that condition is Improved. She is not having side effects.   ------------------------------------------------------------------------------------    Allergies  Allergen Reactions  . Biaxin [Clarithromycin]   . Contrast Media [Iodinated Diagnostic Agents]   . Percocet [Oxycodone-Acetaminophen]    Previous Medications   ASPIRIN-ACETAMINOPHEN-CAFFEINE (EXCEDRIN MIGRAINE) 250-250-65 MG PER TABLET    Take 1  tablet by mouth. AS NEEDED   HYDROCHLOROTHIAZIDE (HYDRODIURIL) 12.5 MG TABLET    Take 1 tablet (12.5 mg total) by mouth daily.   IBUPROFEN (ADVIL,MOTRIN) 200 MG TABLET    Take 200 mg by mouth.   MULTIPLE VITAMINS-MINERALS (WOMENS MULTIVITAMIN PLUS) TABS    Take 1 tablet by mouth daily.   VITAMIN D, ERGOCALCIFEROL, (DRISDOL) 50000 UNITS CAPS CAPSULE    Take 1 capsule (50,000 Units total) by mouth every 30 (thirty) days.    Review of Systems  Constitutional: Negative.   HENT: Negative.   Eyes: Negative.   Respiratory: Negative.   Cardiovascular: Negative.   Endocrine: Negative.   Genitourinary: Negative.   Neurological: Negative.     Social History  Substance Use Topics  . Smoking status: Never Smoker   . Smokeless tobacco: Never Used  . Alcohol Use: No   Objective:   BP 120/86 mmHg  Pulse 76  Temp(Src) 98.7 F (37.1 C) (Oral)  Resp 16  Ht 5' 6.5" (1.689 m)  Wt 129 lb (58.514 kg)  BMI 20.51 kg/m2  SpO2 100%  Physical Exam  Constitutional: She is oriented to person, place, and time. She appears well-developed and well-nourished.  Cardiovascular: Normal rate and regular rhythm.   Pulmonary/Chest: Effort normal and breath sounds normal.  Neurological: She is alert and oriented to person, place, and time.  Psychiatric: She has a normal mood and affect. Her behavior is normal. Judgment and thought content normal.  Assessment & Plan:     1. Migraine without status migrainosus, not intractable, unspecified migraine type Has not had recent episode.     2. Elevated blood-pressure reading without diagnosis of hypertension Improved. Not currently taking medication. Will continue lifestyle and monitor.    3. Encounter for screening mammogram for breast cancer - MM DIGITAL SCREENING BILATERAL; Future      Lorie Phenix, MD  [facility]

## 2014-11-05 ENCOUNTER — Ambulatory Visit
Admission: RE | Admit: 2014-11-05 | Discharge: 2014-11-05 | Disposition: A | Discharge status: Home / Self Care | Payer: BLUE CROSS/BLUE SHIELD | Source: Ambulatory Visit | Attending: Family Medicine | Admitting: Family Medicine

## 2014-11-05 DIAGNOSIS — Z1231 Encounter for screening mammogram for malignant neoplasm of breast: Secondary | ICD-10-CM | POA: Insufficient documentation

## 2015-03-27 ENCOUNTER — Ambulatory Visit (INDEPENDENT_AMBULATORY_CARE_PROVIDER_SITE_OTHER): Payer: BLUE CROSS/BLUE SHIELD | Admitting: Family Medicine

## 2015-03-27 ENCOUNTER — Encounter: Payer: Self-pay | Admitting: Family Medicine

## 2015-03-27 VITALS — BP 128/86 | HR 92 | Temp 98.7°F | Resp 16 | Wt 132.0 lb

## 2015-03-27 DIAGNOSIS — J069 Acute upper respiratory infection, unspecified: Secondary | ICD-10-CM | POA: Diagnosis not present

## 2015-03-27 NOTE — Progress Notes (Signed)
Patient ID: Emily Hurst, female   DOB: November 13, 1965, 50 y.o.   MRN: [id number]       [name] Female    DOB: 06-Dec-1965   50 y.o.   MRN: [id number] Visit Date: 03/27/2015  Today's Provider: Lorie Phenix, MD   Chief Complaint  Patient presents with  . URI   Subjective:    HPI Pt reports that she started having sneezing, congestion about 3 days ago. She started coughing yesterday. She reports that the mucus coming from her nose that  is clear. She can feel the cold going into her chest and her cough has gotten a little more harsh over the last few days.. She did have a low grade fever yesterday evening.No fever today.  She reports that she has been taking OTC cold medications.Has helped some.  She reports that her son has had a similar "cold". She reports that this does not feel like the flu. No body aches.     Allergies  Allergen Reactions  . Biaxin [Clarithromycin]   . Contrast Media [Iodinated Diagnostic Agents]   . Percocet [Oxycodone-Acetaminophen]    Previous Medications   ASPIRIN-ACETAMINOPHEN-CAFFEINE (EXCEDRIN MIGRAINE) 250-250-65 MG PER TABLET    Take 1 tablet by mouth. AS NEEDED   IBUPROFEN (ADVIL,MOTRIN) 200 MG TABLET    Take 200 mg by mouth.   MULTIPLE VITAMINS-MINERALS (WOMENS MULTIVITAMIN PLUS) TABS    Take 1 tablet by mouth daily.   VITAMIN D, ERGOCALCIFEROL, (DRISDOL) 50000 UNITS CAPS CAPSULE    Take 1 capsule (50,000 Units total) by mouth every 30 (thirty) days.    Review of Systems  Constitutional: Positive for fever (low grade) and fatigue. Negative for chills and activity change.  HENT: Positive for congestion, facial swelling, postnasal drip, rhinorrhea and sneezing. Negative for mouth sores and nosebleeds.   Eyes: Negative.   Respiratory: Positive for cough.   Cardiovascular: Negative.   Gastrointestinal: Negative.   Endocrine: Negative.   Genitourinary: Negative.   Musculoskeletal: Positive for arthralgias. Negative for myalgias.  Skin:  Negative.   Allergic/Immunologic: Negative.   Neurological: Positive for headaches.  Hematological: Negative.   Psychiatric/Behavioral: Negative.     Social History  Substance Use Topics  . Smoking status: Never Smoker   . Smokeless tobacco: Never Used  . Alcohol Use: No   Objective:   BP 128/86 mmHg  Pulse 92  Temp(Src) 98.7 F (37.1 C) (Oral)  Resp 16  Wt 132 lb (59.875 kg)  SpO2 99%  Physical Exam  Constitutional: She is oriented to person, place, and time. She appears well-developed and well-nourished.  HENT:  Head: Normocephalic and atraumatic.  Right Ear: External ear normal.  Left Ear: External ear normal.  Nose: Nose normal.  Mouth/Throat: Oropharynx is clear and moist.  Eyes: Conjunctivae and EOM are normal. Pupils are equal, round, and reactive to light.  Neck: Normal range of motion. Neck supple.  Cardiovascular: Normal rate and regular rhythm.   Pulmonary/Chest: Effort normal and breath sounds normal. No respiratory distress. She has no wheezes.  Neurological: She is alert and oriented to person, place, and time.  Psychiatric: She has a normal mood and affect.      Assessment & Plan:      1. Upper respiratory infection New problem.  Suspect viral. Continue OTC treatment. Patient instructed to call back if condition worsens or does not improve.   May need antibiotic.      Lorie Phenix, MD  [facility]

## 2015-03-28 ENCOUNTER — Encounter: Payer: Self-pay | Admitting: Family Medicine

## 2015-06-10 ENCOUNTER — Ambulatory Visit (INDEPENDENT_AMBULATORY_CARE_PROVIDER_SITE_OTHER): Payer: BLUE CROSS/BLUE SHIELD | Admitting: Family Medicine

## 2015-06-10 ENCOUNTER — Encounter: Payer: Self-pay | Admitting: Family Medicine

## 2015-06-10 VITALS — BP 140/100 | HR 76 | Temp 98.3°F | Resp 16 | Ht 66.5 in | Wt 138.0 lb

## 2015-06-10 DIAGNOSIS — Z1211 Encounter for screening for malignant neoplasm of colon: Secondary | ICD-10-CM

## 2015-06-10 DIAGNOSIS — E559 Vitamin D deficiency, unspecified: Secondary | ICD-10-CM

## 2015-06-10 DIAGNOSIS — D509 Iron deficiency anemia, unspecified: Secondary | ICD-10-CM | POA: Diagnosis not present

## 2015-06-10 DIAGNOSIS — E876 Hypokalemia: Secondary | ICD-10-CM

## 2015-06-10 DIAGNOSIS — Z0000 Encounter for general adult medical examination without abnormal findings: Secondary | ICD-10-CM

## 2015-06-10 DIAGNOSIS — R8290 Unspecified abnormal findings in urine: Secondary | ICD-10-CM

## 2015-06-10 DIAGNOSIS — R030 Elevated blood-pressure reading, without diagnosis of hypertension: Secondary | ICD-10-CM

## 2015-06-10 LAB — POCT URINALYSIS DIPSTICK
Bilirubin, UA: NEGATIVE
GLUCOSE UA: NEGATIVE
Ketones, UA: NEGATIVE
Leukocytes, UA: NEGATIVE
NITRITE UA: NEGATIVE
PH UA: 7
RBC UA: NEGATIVE
SPEC GRAV UA: 1.01
UROBILINOGEN UA: 0.2

## 2015-06-10 LAB — IFOBT (OCCULT BLOOD): IMMUNOLOGICAL FECAL OCCULT BLOOD TEST: NEGATIVE

## 2015-06-10 NOTE — Progress Notes (Addendum)
Patient ID: Emily Hurst, female   DOB: 1965/06/01, 50 y.o.   MRN: [id number]       [name], Female    DOB: 06-Jan-1966, 50 y.o.   MRN: [id number] Visit Date: 06/10/2015  Today's Provider: Lorie Phenix, MD   Chief Complaint  Patient presents with  . Annual Exam   Subjective:    Annual physical exam Emily Hurst is a 50 y.o. female who presents today for health maintenance and complete physical. She feels well. She reports exercising none. She reports she is sleeping well. 06/06/14 CPE 03/08/13 Pap-neg; HPV-neg 11/05/14 Mammogram-BI-RADS 1 -----------------------------------------------------------------  Review of Systems  Constitutional: Negative.   HENT: Negative.   Eyes: Negative.   Respiratory: Negative.   Cardiovascular: Negative.   Gastrointestinal: Negative.   Endocrine: Negative.   Genitourinary: Negative.   Musculoskeletal: Negative.   Skin: Negative.   Allergic/Immunologic: Negative.   Neurological: Negative.   Hematological: Negative.   Psychiatric/Behavioral: Negative.     Social History      She  reports that she has never smoked. She has never used smokeless tobacco. She reports that she does not drink alcohol or use illicit drugs.       Social History   Social History  . Marital Status: Divorced    Spouse Name: N/A  . Number of Children: 3  . Years of Education: N/A   Social History Main Topics  . Smoking status: Never Smoker   . Smokeless tobacco: Never Used  . Alcohol Use: No  . Drug Use: No  . Sexual Activity: No   Other Topics Concern  . None   Social History Narrative    Past Medical History  Diagnosis Date  . Migraine   . Hypertension      Patient Active Problem List   Diagnosis Date Noted  . Hypopotassemia 06/10/2015  . Upper respiratory infection 03/27/2015  . Encounter for screening mammogram for breast cancer 10/28/2014  . Body water dehydration 09/29/2014  . Early menopause 09/29/2014  . Vitamin  D deficiency 09/25/2014  . Anemia, iron deficiency 06/25/2008    Past Surgical History  Procedure Laterality Date  . Cesarean section    . Endometrial ablation    . Tubal ligation      Family History        Family Status  Relation Status Death Age  . Son Alive   . Son Alive   . Son Alive     develomental delay        Her family history includes ADD / ADHD in her son; Deep vein thrombosis (age of onset: 60) in her son; Healthy in her son. She was adopted.    Allergies  Allergen Reactions  . Biaxin [Clarithromycin]   . Contrast Media [Iodinated Diagnostic Agents]   . Percocet [Oxycodone-Acetaminophen]     Previous Medications   ASPIRIN-ACETAMINOPHEN-CAFFEINE (EXCEDRIN MIGRAINE) 250-250-65 MG PER TABLET    Take 1 tablet by mouth. AS NEEDED   IBUPROFEN (ADVIL,MOTRIN) 200 MG TABLET    Take 200 mg by mouth.   MULTIPLE VITAMINS-MINERALS (WOMENS MULTIVITAMIN PLUS) TABS    Take 1 tablet by mouth daily.   VITAMIN D, ERGOCALCIFEROL, (DRISDOL) 50000 UNITS CAPS CAPSULE    Take 1 capsule (50,000 Units total) by mouth every 30 (thirty) days.    Patient Care Team: Lorie Phenix, MD as PCP - General (Family Medicine)     Objective:   Vitals: BP 140/100 mmHg  Pulse 76  Temp(Src) 98.3 F (36.8  C) (Oral)  Resp 16  Ht 5' 6.5" (1.689 m)  Wt 138 lb (62.596 kg)  BMI 21.94 kg/m2   Physical Exam  Constitutional: She is oriented to person, place, and time. She appears well-developed and well-nourished.  HENT:  Head: Normocephalic and atraumatic.  Right Ear: Tympanic membrane, external ear and ear canal normal.  Left Ear: Tympanic membrane, external ear and ear canal normal.  Nose: Nose normal.  Mouth/Throat: Uvula is midline, oropharynx is clear and moist and mucous membranes are normal.  Eyes: Conjunctivae, EOM and lids are normal. Pupils are equal, round, and reactive to light.  Neck: Trachea normal and normal range of motion. Neck supple. Carotid bruit is not present. No thyroid  mass and no thyromegaly present.  Cardiovascular: Normal rate, regular rhythm and normal heart sounds.   Pulmonary/Chest: Effort normal and breath sounds normal.  Abdominal: Soft. Normal appearance and bowel sounds are normal. There is no hepatosplenomegaly. There is no tenderness.  Genitourinary: No breast swelling, tenderness or discharge.  Musculoskeletal: Normal range of motion.  Lymphadenopathy:    She has no cervical adenopathy.    She has no axillary adenopathy.  Neurological: She is alert and oriented to person, place, and time. She has normal strength. No cranial nerve deficit.  Skin: Skin is warm, dry and intact.  Psychiatric: She has a normal mood and affect. Her speech is normal and behavior is normal. Judgment and thought content normal. Cognition and memory are normal.    Depression Screen PHQ 2/9 Scores 06/10/2015  PHQ - 2 Score 1    Assessment & Plan:     Routine Health Maintenance and Physical Exam  Exercise Activities and Dietary recommendations Goals    . Exercise 150 minutes per week (moderate activity)       Immunization History  Administered Date(s) Administered  . Td 06/29/2006  . Tdap 06/29/2006      1. Annual physical exam Stable. Patient advised to continue eating healthy and exercise daily. - POCT urinalysis dipstick  2. Colon cancer screening - IFOBT POC (occult bld, rslt in office) Results for orders placed or performed in visit on 06/10/15  POCT urinalysis dipstick  Result Value Ref Range   Color, UA yellow    Clarity, UA clear    Glucose, UA neg    Bilirubin, UA neg    Ketones, UA neg    Spec Grav, UA 1.010    Blood, UA neg    pH, UA 7.0    Protein, UA trace    Urobilinogen, UA 0.2    Nitrite, UA neg    Leukocytes, UA Negative Negative  IFOBT POC (occult bld, rslt in office)  Result Value Ref Range   IFOBT Negative     3. Elevated blood-pressure reading without diagnosis of hypertension Blood pressure elevated today.  Patient reports she is monitoring blood pressure and have been stable. - Comprehensive metabolic panel - Lipid Panel With LDL/HDL Ratio - TSH  4. Anemia, iron deficiency - CBC with Differential/Platelet  5. Vitamin D deficiency - VITAMIN D 25 Hydroxy (Vit-D Deficiency, Fractures)  6. Hypopotassemia - Comprehensive metabolic panel  7. Abnormal urine - Urine Microscopic     Patient seen and examined by Dr. Leo GrosserNancy J.. Nasario Czerniak, and note scribed by Liz BeachSulibeya S. Dimas, CMA.  I have reviewed the document for accuracy and completeness and I agree with above. Leo Grosser- Caedan Sumler J. Shoua Ulloa, MD   Lorie PhenixNancy Jarick Harkins, MD   --------------------------------------------------------------------

## 2015-06-10 NOTE — Addendum Note (Signed)
Addended by: Leo GrosserMALONEY, Dollie Mayse J on: 06/10/2015 11:06 AM   Modules accepted: SmartSet

## 2015-06-11 LAB — COMPREHENSIVE METABOLIC PANEL
A/G RATIO: 1.9 (ref 1.2–2.2)
ALBUMIN: 4.7 g/dL (ref 3.5–5.5)
ALT: 17 IU/L (ref 0–32)
AST: 21 IU/L (ref 0–40)
Alkaline Phosphatase: 57 IU/L (ref 39–117)
BUN/Creatinine Ratio: 11 (ref 9–23)
BUN: 11 mg/dL (ref 6–24)
Bilirubin Total: 0.6 mg/dL (ref 0.0–1.2)
CALCIUM: 9.2 mg/dL (ref 8.7–10.2)
CO2: 24 mmol/L (ref 18–29)
Chloride: 100 mmol/L (ref 96–106)
Creatinine, Ser: 0.96 mg/dL (ref 0.57–1.00)
GFR, EST AFRICAN AMERICAN: 80 mL/min/{1.73_m2} (ref >59)
GFR, EST NON AFRICAN AMERICAN: 70 mL/min/{1.73_m2} (ref >59)
GLOBULIN, TOTAL: 2.5 g/dL (ref 1.5–4.5)
Glucose: 92 mg/dL (ref 65–99)
POTASSIUM: 4.2 mmol/L (ref 3.5–5.2)
SODIUM: 140 mmol/L (ref 134–144)
TOTAL PROTEIN: 7.2 g/dL (ref 6.0–8.5)

## 2015-06-11 LAB — CBC WITH DIFFERENTIAL/PLATELET
BASOS: 1 %
Basophils Absolute: 0 10*3/uL (ref 0.0–0.2)
EOS (ABSOLUTE): 0.1 10*3/uL (ref 0.0–0.4)
Eos: 2 %
HEMATOCRIT: 38.7 % (ref 34.0–46.6)
Hemoglobin: 13.1 g/dL (ref 11.1–15.9)
IMMATURE GRANS (ABS): 0 10*3/uL (ref 0.0–0.1)
IMMATURE GRANULOCYTES: 0 %
LYMPHS: 29 %
Lymphocytes Absolute: 1.3 10*3/uL (ref 0.7–3.1)
MCH: 31.3 pg (ref 26.6–33.0)
MCHC: 33.9 g/dL (ref 31.5–35.7)
MCV: 93 fL (ref 79–97)
MONOCYTES: 7 %
MONOS ABS: 0.3 10*3/uL (ref 0.1–0.9)
NEUTROS ABS: 2.7 10*3/uL (ref 1.4–7.0)
NEUTROS PCT: 61 %
Platelets: 261 10*3/uL (ref 150–379)
RBC: 4.18 x10E6/uL (ref 3.77–5.28)
RDW: 13.4 % (ref 12.3–15.4)
WBC: 4.4 10*3/uL (ref 3.4–10.8)

## 2015-06-11 LAB — URINALYSIS, MICROSCOPIC ONLY
BACTERIA UA: NONE SEEN
Casts: NONE SEEN /lpf

## 2015-06-11 LAB — LIPID PANEL WITH LDL/HDL RATIO
Cholesterol, Total: 233 mg/dL — ABNORMAL HIGH (ref 100–199)
HDL: 95 mg/dL (ref >39)
LDL Calculated: 126 mg/dL — ABNORMAL HIGH (ref 0–99)
LDL/HDL RATIO: 1.3 ratio (ref 0.0–3.2)
Triglycerides: 58 mg/dL (ref 0–149)
VLDL Cholesterol Cal: 12 mg/dL (ref 5–40)

## 2015-06-11 LAB — TSH: TSH: 1.19 u[IU]/mL (ref 0.450–4.500)

## 2015-06-11 LAB — VITAMIN D 25 HYDROXY (VIT D DEFICIENCY, FRACTURES): Vit D, 25-Hydroxy: 17.6 ng/mL — ABNORMAL LOW (ref 30.0–100.0)

## 2015-06-15 ENCOUNTER — Telehealth: Payer: Self-pay

## 2015-06-15 NOTE — Telephone Encounter (Signed)
-----   Message from Lorie PhenixNancy Maloney, MD sent at 06/14/2015  1:26 PM EDT ----- Labs stable.  Cholesterol elevated, but balanced by very high good cholesterol.  Vit D is still low at 17.  Would recommend trail of VIt D 2000 iu otc because patient may remember better that way.  Thanks.

## 2015-06-15 NOTE — Telephone Encounter (Signed)
Pt advised as directed below.   Thanks,   -Elie Leppo  

## 2016-03-07 ENCOUNTER — Ambulatory Visit (INDEPENDENT_AMBULATORY_CARE_PROVIDER_SITE_OTHER): Payer: BLUE CROSS/BLUE SHIELD | Admitting: Physician Assistant

## 2016-03-07 ENCOUNTER — Encounter: Payer: Self-pay | Admitting: Physician Assistant

## 2016-03-07 ENCOUNTER — Ambulatory Visit: Payer: BLUE CROSS/BLUE SHIELD | Admitting: Family Medicine

## 2016-03-07 VITALS — BP 152/100 | HR 87 | Temp 98.5°F | Resp 16 | Wt 131.0 lb

## 2016-03-07 DIAGNOSIS — R059 Cough, unspecified: Secondary | ICD-10-CM

## 2016-03-07 DIAGNOSIS — J069 Acute upper respiratory infection, unspecified: Secondary | ICD-10-CM

## 2016-03-07 DIAGNOSIS — R05 Cough: Secondary | ICD-10-CM | POA: Diagnosis not present

## 2016-03-07 MED ORDER — HYDROCODONE-HOMATROPINE 5-1.5 MG/5ML PO SYRP: 5.0000 mL | ORAL_SOLUTION | Freq: Three times a day (TID) | ORAL | 0 refills | Status: DC | PRN

## 2016-03-07 MED ORDER — AMOXICILLIN-POT CLAVULANATE 875-125 MG PO TABS: 1.0000 | ORAL_TABLET | Freq: Two times a day (BID) | ORAL | 0 refills | Status: DC

## 2016-03-07 NOTE — Patient Instructions (Signed)
Upper Respiratory Infection, Adult Most upper respiratory infections (URIs) are a viral infection of the air passages leading to the lungs. A URI affects the nose, throat, and upper air passages. The most common type of URI is nasopharyngitis and is typically referred to as "the common cold." URIs run their course and usually go away on their own. Most of the time, a URI does not require medical attention, but sometimes a bacterial infection in the upper airways can follow a viral infection. This is called a secondary infection. Sinus and middle ear infections are common types of secondary upper respiratory infections. Bacterial pneumonia can also complicate a URI. A URI can worsen asthma and chronic obstructive pulmonary disease (COPD). Sometimes, these complications can require emergency medical care and may be life threatening. What are the causes? Almost all URIs are caused by viruses. A virus is a type of germ and can spread from one person to another. What increases the risk? You may be at risk for a URI if:  You smoke.  You have chronic heart or lung disease.  You have a weakened defense (immune) system.  You are very young or very old.  You have nasal allergies or asthma.  You work in crowded or poorly ventilated areas.  You work in health care facilities or schools.  What are the signs or symptoms? Symptoms typically develop 2-3 days after you come in contact with a cold virus. Most viral URIs last 7-10 days. However, viral URIs from the influenza virus (flu virus) can last 14-18 days and are typically more severe. Symptoms may include:  Runny or stuffy (congested) nose.  Sneezing.  Cough.  Sore throat.  Headache.  Fatigue.  Fever.  Loss of appetite.  Pain in your forehead, behind your eyes, and over your cheekbones (sinus pain).  Muscle aches.  How is this diagnosed? Your health care provider may diagnose a URI by:  Physical exam.  Tests to check that your  symptoms are not due to another condition such as: ? Strep throat. ? Sinusitis. ? Pneumonia. ? Asthma.  How is this treated? A URI goes away on its own with time. It cannot be cured with medicines, but medicines may be prescribed or recommended to relieve symptoms. Medicines may help:  Reduce your fever.  Reduce your cough.  Relieve nasal congestion.  Follow these instructions at home:  Take medicines only as directed by your health care provider.  Gargle warm saltwater or take cough drops to comfort your throat as directed by your health care provider.  Use a warm mist humidifier or inhale steam from a shower to increase air moisture. This may make it easier to breathe.  Drink enough fluid to keep your urine clear or pale yellow.  Eat soups and other clear broths and maintain good nutrition.  Rest as needed.  Return to work when your temperature has returned to normal or as your health care provider advises. You may need to stay home longer to avoid infecting others. You can also use a face mask and careful hand washing to prevent spread of the virus.  Increase the usage of your inhaler if you have asthma.  Do not use any tobacco products, including cigarettes, chewing tobacco, or electronic cigarettes. If you need help quitting, ask your health care provider. How is this prevented? The best way to protect yourself from getting a cold is to practice good hygiene.  Avoid oral or hand contact with people with cold symptoms.  Wash your   hands often if contact occurs.  There is no clear evidence that vitamin C, vitamin E, echinacea, or exercise reduces the chance of developing a cold. However, it is always recommended to get plenty of rest, exercise, and practice good nutrition. Contact a health care provider if:  You are getting worse rather than better.  Your symptoms are not controlled by medicine.  You have chills.  You have worsening shortness of breath.  You have  brown or red mucus.  You have yellow or brown nasal discharge.  You have pain in your face, especially when you bend forward.  You have a fever.  You have swollen neck glands.  You have pain while swallowing.  You have white areas in the back of your throat. Get help right away if:  You have severe or persistent: ? Headache. ? Ear pain. ? Sinus pain. ? Chest pain.  You have chronic lung disease and any of the following: ? Wheezing. ? Prolonged cough. ? Coughing up blood. ? A change in your usual mucus.  You have a stiff neck.  You have changes in your: ? Vision. ? Hearing. ? Thinking. ? Mood. This information is not intended to replace advice given to you by your health care provider. Make sure you discuss any questions you have with your health care provider. Document Released: 07/13/2000 Document Revised: 09/20/2015 Document Reviewed: 04/24/2013 Elsevier Interactive Patient Education  2017 [employer]  

## 2016-03-07 NOTE — Progress Notes (Signed)
Patient: Emily BackerLisa R Mccarrick Female    DOB: Feb 10, 1965   51 y.o.   MRN: [id number] Visit Date: 03/07/2016  Today's Provider: Margaretann LovelessJennifer M Otha Rickles, PA-C   Chief Complaint  Patient presents with  . URI   Subjective:    HPI Upper Respiratory Infection: Patient complains of possible sinusitis. Symptoms include sore throat and PND. Onset of symptoms was 1 week ago, gradually worsening since that time. She also c/o sore throat and fatigue for the past 1 day .  She is drinking plenty of fluids. Evaluation to date: none. Treatment to date: cough suppressants and decongestants. Patient reports that she had a low grade fever last night.      Allergies  Allergen Reactions  . Biaxin [Clarithromycin]   . Contrast Media [Iodinated Diagnostic Agents]   . Percocet [Oxycodone-Acetaminophen]    Patient Active Problem List   Diagnosis Date Noted  . Hypopotassemia 06/10/2015  . Upper respiratory infection 03/27/2015  . Encounter for screening mammogram for breast cancer 10/28/2014  . Body water dehydration 09/29/2014  . Early menopause 09/29/2014  . Vitamin D deficiency 09/25/2014  . Anemia, iron deficiency 06/25/2008     Current Outpatient Prescriptions:  .  aspirin-acetaminophen-caffeine (EXCEDRIN MIGRAINE) 250-250-65 MG per tablet, Take 1 tablet by mouth. AS NEEDED, Disp: , Rfl:  .  ibuprofen (ADVIL,MOTRIN) 200 MG tablet, Take 200 mg by mouth., Disp: , Rfl:  .  Multiple Vitamins-Minerals (WOMENS MULTIVITAMIN PLUS) TABS, Take 1 tablet by mouth daily., Disp: , Rfl:  .  Vitamin D, Ergocalciferol, (DRISDOL) 50000 UNITS CAPS capsule, Take 1 capsule (50,000 Units total) by mouth every 30 (thirty) days. (Patient not taking: Reported on 03/27/2015), Disp: 3 capsule, Rfl: 3  Review of Systems  Constitutional: Positive for chills, fatigue and fever (low grade).  HENT: Positive for congestion, postnasal drip, rhinorrhea, sinus pressure, sore throat and voice change. Negative for ear discharge, ear  pain, sinus pain, sneezing, tinnitus and trouble swallowing.   Respiratory: Positive for cough. Negative for chest tightness, shortness of breath and wheezing.   Cardiovascular: Negative.   Gastrointestinal: Negative for abdominal pain and nausea.  Neurological: Positive for headaches. Negative for dizziness.    Social History  Substance Use Topics  . Smoking status: Never Smoker  . Smokeless tobacco: Never Used  . Alcohol use No   Objective:   BP (!) 152/100 (BP Location: Left Arm, Patient Position: Sitting, Cuff Size: Normal)   Pulse 87   Temp 98.5 F (36.9 C) (Oral)   Resp 16   Wt 131 lb (59.4 kg)   SpO2 97%   BMI 20.83 kg/m   Physical Exam  Constitutional: She appears well-developed and well-nourished. No distress.  HENT:  Head: Normocephalic and atraumatic.  Right Ear: Hearing, tympanic membrane, external ear and ear canal normal.  Left Ear: Hearing, tympanic membrane, external ear and ear canal normal.  Nose: Mucosal edema present. No rhinorrhea. Right sinus exhibits no maxillary sinus tenderness and no frontal sinus tenderness. Left sinus exhibits no maxillary sinus tenderness and no frontal sinus tenderness.  Mouth/Throat: Uvula is midline, oropharynx is clear and moist and mucous membranes are normal. No oropharyngeal exudate, posterior oropharyngeal edema or posterior oropharyngeal erythema.  Hoarse voice  Eyes: Conjunctivae are normal. Pupils are equal, round, and reactive to light. Right eye exhibits no discharge. Left eye exhibits no discharge. No scleral icterus.  Neck: Normal range of motion. Neck supple. No tracheal deviation present. No thyromegaly present.  Cardiovascular: Normal rate, regular rhythm  and normal heart sounds.  Exam reveals no gallop and no friction rub.   No murmur heard. Pulmonary/Chest: Effort normal and breath sounds normal. No stridor. No respiratory distress. She has no wheezes. She has no rales.  Lymphadenopathy:    She has no cervical  adenopathy.  Skin: Skin is warm and dry. She is not diaphoretic.  Vitals reviewed.     Assessment & Plan:     1. Upper respiratory tract infection, unspecified type Worsening symptoms that have not responded to OTC medications. Will give augmentin as below. Continue robitussin-DM for daytime cough. Stay well hydrated and get plenty of rest. Call if no symptom improvement or if symptoms worsen. - amoxicillin-clavulanate (AUGMENTIN) 875-125 MG tablet; Take 1 tablet by mouth 2 (two) times daily.  Dispense: 20 tablet; Refill: 0  2. Cough Worsening symptoms that has not responded to OTC medications. Will give Hycodan cough syrup as below for nighttime cough. Drowsiness precautions given to patient. Stay well hydrated. Use robitussin for daytime cough. - HYDROcodone-homatropine (HYCODAN) 5-1.5 MG/5ML syrup; Take 5 mLs by mouth every 8 (eight) hours as needed for cough.  Dispense: 120 mL; Refill: 0       Margaretann Loveless, PA-C  [facility]

## 2016-04-08 ENCOUNTER — Ambulatory Visit (INDEPENDENT_AMBULATORY_CARE_PROVIDER_SITE_OTHER): Payer: BLUE CROSS/BLUE SHIELD | Admitting: Family Medicine

## 2016-04-08 ENCOUNTER — Ambulatory Visit
Admission: RE | Admit: 2016-04-08 | Discharge: 2016-04-08 | Disposition: A | Discharge status: Home / Self Care | Payer: BLUE CROSS/BLUE SHIELD | Source: Ambulatory Visit | Attending: Family Medicine | Admitting: Family Medicine

## 2016-04-08 ENCOUNTER — Encounter: Payer: Self-pay | Admitting: Family Medicine

## 2016-04-08 VITALS — BP 136/84 | HR 84 | Temp 98.8°F | Resp 16 | Wt 134.0 lb

## 2016-04-08 DIAGNOSIS — R1031 Right lower quadrant pain: Secondary | ICD-10-CM | POA: Insufficient documentation

## 2016-04-08 LAB — POCT URINALYSIS DIPSTICK
Bilirubin, UA: NEGATIVE
Blood, UA: NEGATIVE
GLUCOSE UA: NEGATIVE
Ketones, UA: NEGATIVE
LEUKOCYTES UA: NEGATIVE
NITRITE UA: NEGATIVE
PROTEIN UA: NEGATIVE
SPEC GRAV UA: 1.025
UROBILINOGEN UA: 0.2
pH, UA: 6

## 2016-04-08 LAB — POCT URINE PREGNANCY: PREG TEST UR: NEGATIVE

## 2016-04-08 MED ORDER — CIPROFLOXACIN HCL 500 MG PO TABS: 500.0000 mg | ORAL_TABLET | Freq: Two times a day (BID) | ORAL | 0 refills | Status: AC

## 2016-04-08 MED ORDER — METRONIDAZOLE 500 MG PO TABS: 500.0000 mg | ORAL_TABLET | Freq: Two times a day (BID) | ORAL | 0 refills | Status: AC

## 2016-04-08 NOTE — Progress Notes (Signed)
[name] Female    DOB: Apr 09, 1965   51 y.o.   MRN: [id number] Visit Date: 04/08/2016  Today's Provider: Mila Merry, MD   Chief Complaint  Patient presents with  . Abdominal Pain    Right sided    Subjective:    Abdominal Pain  This is a new problem. The current episode started today. The onset quality is sudden (Started suddenly at 1am this morning, woke pt from sleep. ). The problem occurs constantly. Duration: Lasted until about 5 am this morning. The problem has been gradually improving (Has improved but pain is still there. ). The pain is located in the RLQ. The pain is at a severity of 6/10 (Was as high as 9/10 this morning. ). The quality of the pain is sharp. The abdominal pain does not radiate. Associated symptoms include constipation. Pertinent negatives include no diarrhea, dysuria, fever, frequency, headaches, hematuria, nausea or vomiting. Nothing aggravates the pain. Relieved by: Changing positions.  She has tried nothing for the symptoms.      Allergies  Allergen Reactions  . Biaxin [Clarithromycin]   . Contrast Media [Iodinated Diagnostic Agents]   . Percocet [Oxycodone-Acetaminophen]      Current Outpatient Prescriptions:  .  aspirin-acetaminophen-caffeine (EXCEDRIN MIGRAINE) 250-250-65 MG per tablet, Take 1 tablet by mouth. AS NEEDED, Disp: , Rfl:  .  ibuprofen (ADVIL,MOTRIN) 200 MG tablet, Take 200 mg by mouth., Disp: , Rfl:  .  Multiple Vitamins-Minerals (WOMENS MULTIVITAMIN PLUS) TABS, Take 1 tablet by mouth daily., Disp: , Rfl:   Review of Systems  Constitutional: Negative for activity change, appetite change, chills, diaphoresis, fatigue, fever and unexpected weight change.  Gastrointestinal: Positive for abdominal pain and constipation. Negative for abdominal distention, anal bleeding, blood in stool, diarrhea, nausea, rectal pain and vomiting.  Genitourinary: Negative for decreased urine volume, difficulty urinating, dyspareunia,  dysuria, frequency, hematuria, urgency, vaginal bleeding, vaginal discharge and vaginal pain.  Neurological: Negative for dizziness, light-headedness and headaches.    Social History  Substance Use Topics  . Smoking status: Never Smoker  . Smokeless tobacco: Never Used  . Alcohol use No   Objective:   BP 136/84 (BP Location: Left Arm, Patient Position: Sitting, Cuff Size: Normal)   Pulse 84   Temp 98.8 F (37.1 C) (Oral)   Resp 16   Wt 134 lb (60.8 kg)   BMI 21.30 kg/m  Vitals:   04/08/16 1535  BP: 136/84  Pulse: 84  Resp: 16  Temp: 98.8 F (37.1 C)  TempSrc: Oral  Weight: 134 lb (60.8 kg)     Physical Exam  General Appearance:    Alert, cooperative, no distress  Eyes:    PERRL, conjunctiva/corneas clear, EOM's intact       Lungs:     Clear to auscultation bilaterally, respirations unlabored  Heart:    Regular rate and rhythm  Abdomen:   bowel sounds present and normal in all 4 quadrants, soft, round or nondistended. Moderate right lower quadrant tenderness. No rebound or guarding. No CVA tenderness, CVA tenderness is present on the right    Abd: Xr: excessive gas and stool.    Results for orders placed or performed in visit on 04/08/16  POCT urinalysis dipstick  Result Value Ref Range   Color, UA Yellow    Clarity, UA Clear    Glucose, UA Negative    Bilirubin, UA Negative    Ketones, UA Negative    Spec Grav, UA 1.025  Blood, UA Negative    pH, UA 6.0    Protein, UA Negative    Urobilinogen, UA 0.2    Nitrite, UA Negative    Leukocytes, UA Negative Negative  POCT urine pregnancy  Result Value Ref Range   Preg Test, Ur Negative Negative       Assessment & Plan:     1. Right lower quadrant abdominal pain Discussed Dx with patient including renal calculi (which she states presented with similar symptoms in the past), constipation and appendicitis. She would only be able to have CT or ultrasound today if she went through the emergency room. Pain has  subsided considerably. However she was advised that symptoms could be due to appendicitis. She appears wel at this time, but agrees to go to emergency room if pain worsens, or if she develops any nausea, vomiting, or fever. Will cover with Cipro and flagyl for now. If symptoms not resolved over the weekend will set up abdominal CT scan.  .she expresses clear understanding, and is comfortable with this plan.   - DG Abd 2 Views; Future - POCT urinalysis dipstick - POCT urine pregnancy     The entirety of the information documented in the History of Present Illness, Review of Systems and Physical Exam were personally obtained by me. Portions of this information were initially documented by Kavin LeechLaura Walsh, CMA and reviewed by me for thoroughness and accuracy.    Mila Merryonald Marcellius Montagna, MD  [facility]

## 2016-04-09 ENCOUNTER — Telehealth: Payer: Self-pay | Admitting: Family Medicine

## 2016-04-09 DIAGNOSIS — R1031 Right lower quadrant pain: Secondary | ICD-10-CM

## 2016-04-09 NOTE — Telephone Encounter (Signed)
Abdominal X-ray normal. Please call patient to see if pain is better today. If not improving, or if any fevers, chills, sweats, or vomiting she needs to go to ER for further CT

## 2016-04-11 ENCOUNTER — Telehealth: Payer: Self-pay | Admitting: Family Medicine

## 2016-04-11 NOTE — Telephone Encounter (Signed)
Please advise that she needs abdominal and pelvic ultrasound for RLQ if pain has not resolved. Need to rule out ruptured ovarian cyst and rule out appendicitis. Continue abx until u/s is done.

## 2016-04-11 NOTE — Telephone Encounter (Signed)
Please advise 

## 2016-04-11 NOTE — Addendum Note (Signed)
Addended by: Marlene LardMILLER, Anjolina Byrer M on: 04/11/2016 11:27 AM   Modules accepted: Orders

## 2016-04-11 NOTE — Telephone Encounter (Signed)
Patient needs to be advised that she needs u/s. Please advise patient  and send order to sarah to schedule. Thanks.

## 2016-04-11 NOTE — Telephone Encounter (Signed)
Pt advised.  She reports she is feeling better but the pain is not completely gone.  She denies nausea, vomiting, fevers.  She was wondering what the antibiotics were for.  I advised her it looked like you were covering for potential diverticulitis.     Thanks,   -Vernona RiegerLaura

## 2016-04-11 NOTE — Telephone Encounter (Signed)
Patient was notified. Please schedule US abdomen and US pelvis. Thanks!

## 2016-04-11 NOTE — Telephone Encounter (Signed)
Please check with patient to see if abdominal pain has resolved. If so then just finish antibiotics. If not then she will need abdominal & pelvic ultrasound for RLQ pain scheduled.

## 2016-04-12 ENCOUNTER — Other Ambulatory Visit: Payer: Self-pay | Admitting: Family Medicine

## 2016-04-12 ENCOUNTER — Telehealth: Payer: Self-pay | Admitting: Physician Assistant

## 2016-04-12 DIAGNOSIS — R1031 Right lower quadrant pain: Secondary | ICD-10-CM

## 2016-04-12 NOTE — Telephone Encounter (Signed)
Order in epic. 

## 2016-04-12 NOTE — Telephone Encounter (Signed)
Pt is returning call to Michelle/Dr Sherrie MustacheFisher nurse.  CB#[phone]/MW

## 2016-04-14 ENCOUNTER — Telehealth: Payer: Self-pay

## 2016-04-14 ENCOUNTER — Other Ambulatory Visit: Payer: Self-pay | Admitting: *Deleted

## 2016-04-14 ENCOUNTER — Other Ambulatory Visit: Payer: Self-pay

## 2016-04-14 DIAGNOSIS — R1031 Right lower quadrant pain: Secondary | ICD-10-CM

## 2016-04-14 NOTE — Telephone Encounter (Signed)
Amber from [facility] called stating that patient is scheduled to have an abdominal US tomorrow. Amber wants to know the reason for ordering this.  Is he looking for something other than appendicitis? She states to check for appendicitis they don't do abdominal ultrasounds for patients 18 and older; they would need to do a CT. Please advise. Call back [phone]

## 2016-04-14 NOTE — Telephone Encounter (Signed)
Can change test to CT but she needs done before the weekend. Have entered new order.

## 2016-04-14 NOTE — Telephone Encounter (Signed)
Spoke with Selena BattenKim at [facility] she states to rule out appendicitis it has to be looked at with CT. Her question is do you still want to do a US also? She said the CT can rule out both appendicitis and ovarian cyst.  Also the order needs to be changed to abdomen/pelvis CT with contrast.   The upper abdomen complete US ordered will only look at liver, kidneys, gall bladder (upper organs). So if you want a US done also. A new order will need to be put in for that.

## 2016-04-14 NOTE — Telephone Encounter (Signed)
She needs abdominal and pelvic ultrasound to rule out appendicitis and to rule out ovarian cyst.

## 2016-04-14 NOTE — Telephone Encounter (Signed)
LMTCB

## 2016-04-15 ENCOUNTER — Telehealth: Payer: Self-pay

## 2016-04-15 ENCOUNTER — Ambulatory Visit: Admission: RE | Admit: 2016-04-15 | Payer: BLUE CROSS/BLUE SHIELD | Source: Ambulatory Visit

## 2016-04-15 ENCOUNTER — Other Ambulatory Visit: Payer: Self-pay | Admitting: Family Medicine

## 2016-04-15 ENCOUNTER — Ambulatory Visit
Admission: RE | Admit: 2016-04-15 | Discharge: 2016-04-15 | Disposition: A | Discharge status: Home / Self Care | Payer: BLUE CROSS/BLUE SHIELD | Source: Ambulatory Visit | Attending: Family Medicine | Admitting: Family Medicine

## 2016-04-15 ENCOUNTER — Ambulatory Visit: Payer: BLUE CROSS/BLUE SHIELD

## 2016-04-15 DIAGNOSIS — R1031 Right lower quadrant pain: Secondary | ICD-10-CM

## 2016-04-15 NOTE — Telephone Encounter (Signed)
Pt advised and agrees with treatment plan. Referral already in progress. Allene DillonEmily Drozdowski, CMA

## 2016-04-15 NOTE — Telephone Encounter (Signed)
FYI:: French Anaracy with radiology called to report CT results are finalized.

## 2016-04-15 NOTE — Telephone Encounter (Signed)
-----   Message from Malva Limesonald E Fisher, MD sent at 04/15/2016  4:56 PM EDT ----- Slightly enlarged appendix. Might be normal, but could be a mild case of appendicitis. Recommend referral to surgery... We should be able to get her in with surgeon early next week, but if pain worsens, or if she has any fevers, nausea, or vomiting she should go to ER.

## 2016-04-18 ENCOUNTER — Telehealth: Payer: Self-pay | Admitting: Family Medicine

## 2016-04-18 NOTE — Telephone Encounter (Signed)
Please advise 

## 2016-04-18 NOTE — Telephone Encounter (Signed)
Pt has an appointment with Dr Lemar LivingsByrnett tomorrow 04/19/16.She wants to know if she should continue taking the antibiotic that was prescrobed

## 2016-04-18 NOTE — Telephone Encounter (Signed)
Yes, continue taking until they are gone.

## 2016-04-18 NOTE — Telephone Encounter (Signed)
Advised patient as below.  

## 2016-04-19 ENCOUNTER — Encounter: Payer: Self-pay | Admitting: General Surgery

## 2016-04-19 ENCOUNTER — Ambulatory Visit (INDEPENDENT_AMBULATORY_CARE_PROVIDER_SITE_OTHER): Payer: BLUE CROSS/BLUE SHIELD | Admitting: General Surgery

## 2016-04-19 VITALS — BP 156/92 | HR 88 | Temp 97.7°F | Resp 14 | Ht 66.0 in | Wt 130.0 lb

## 2016-04-19 DIAGNOSIS — R1031 Right lower quadrant pain: Secondary | ICD-10-CM

## 2016-04-19 LAB — CBC WITH DIFFERENTIAL/PLATELET
Basophils Absolute: 0 10*3/uL (ref 0.0–0.2)
Basos: 1 %
EOS (ABSOLUTE): 0.1 10*3/uL (ref 0.0–0.4)
EOS: 1 %
HEMATOCRIT: 38.1 % (ref 34.0–46.6)
Hemoglobin: 13 g/dL (ref 11.1–15.9)
Immature Grans (Abs): 0 10*3/uL (ref 0.0–0.1)
Immature Granulocytes: 0 %
Lymphocytes Absolute: 0.8 10*3/uL (ref 0.7–3.1)
Lymphs: 13 %
MCH: 31.9 pg (ref 26.6–33.0)
MCHC: 34.1 g/dL (ref 31.5–35.7)
MCV: 93 fL (ref 79–97)
Monocytes Absolute: 0.5 10*3/uL (ref 0.1–0.9)
Monocytes: 9 %
NEUTROS ABS: 4.8 10*3/uL (ref 1.4–7.0)
Neutrophils: 76 %
Platelets: 234 10*3/uL (ref 150–379)
RBC: 4.08 x10E6/uL (ref 3.77–5.28)
RDW: 13.4 % (ref 12.3–15.4)
WBC: 6.2 10*3/uL (ref 3.4–10.8)

## 2016-04-19 LAB — C-REACTIVE PROTEIN: CRP: 0.1 mg/L (ref 0.0–4.9)

## 2016-04-19 LAB — SEDIMENTATION RATE: Sed Rate: 5 mm/hr (ref 0–40)

## 2016-04-19 NOTE — Patient Instructions (Addendum)
The patient is aware to call back for any questions or concerns. Follow up appointment to be announced. 

## 2016-04-19 NOTE — Progress Notes (Signed)
Patient ID: Emily Hurst, female   DOB: 1965-02-09, 51 y.o.   MRN: [id number]  Chief Complaint  Patient presents with  . Other    abnormal CT    HPI Emily Hurst is a 51 y.o. female.  Here today for evaluation of an abnormal CT, enlarged appendix. Patient states she has been having abdominal  pain in her lower right quadrant. The pain has been going on since 04/08/2016. Patient woke up in the middle of the night and couldn't lay on her left side. Ct scan was done on 04/15/2016. She has been having a lot diarrhea, no fever or chills. Moves her bowels every three days.   HPI  Past Medical History:  Diagnosis Date  . Hypertension   . Migraine     Past Surgical History:  Procedure Laterality Date  . CESAREAN SECTION    . ENDOMETRIAL ABLATION    . TUBAL LIGATION      Family History  Problem Relation Age of Onset  . Adopted: Yes  . Deep vein thrombosis Son 22  . Healthy Son   . ADD / ADHD Son     Social History Social History  Substance Use Topics  . Smoking status: Never Smoker  . Smokeless tobacco: Never Used  . Alcohol use No    Allergies  Allergen Reactions  . Biaxin [Clarithromycin] Other (See Comments)  . Contrast Media [Iodinated Diagnostic Agents]   . Percocet [Oxycodone-Acetaminophen]     Current Outpatient Prescriptions  Medication Sig Dispense Refill  . aspirin-acetaminophen-caffeine (EXCEDRIN MIGRAINE) 250-250-65 MG per tablet Take 1 tablet by mouth. AS NEEDED    . ibuprofen (ADVIL,MOTRIN) 200 MG tablet Take 200 mg by mouth.    . Multiple Vitamins-Minerals (WOMENS MULTIVITAMIN PLUS) TABS Take 1 tablet by mouth daily.     No current facility-administered medications for this visit.     Review of Systems Review of Systems  Constitutional: Negative.   Respiratory: Negative.   Cardiovascular: Negative.   Gastrointestinal: Positive for abdominal pain, diarrhea and nausea.    Blood pressure (!) 156/92, pulse 88, temperature 97.7 F (36.5 C),  temperature source Oral, resp. rate 14, height 5\' 6"  (1.676 m), weight 130 lb (59 kg).  Physical Exam Physical Exam  Constitutional: She is oriented to person, place, and time. She appears well-developed and well-nourished.  Eyes: Conjunctivae are normal. No scleral icterus.  Neck: Neck supple.  Cardiovascular: Normal rate, regular rhythm and normal heart sounds.   Pulmonary/Chest: Effort normal and breath sounds normal.  Abdominal: Soft. Normal appearance and bowel sounds are normal. There is no hepatomegaly. There is tenderness in the right lower quadrant. No hernia.  Lymphadenopathy:    She has no cervical adenopathy.  Neurological: She is alert and oriented to person, place, and time.  Skin: Skin is warm and dry.  Psychiatric: Her behavior is normal.    Data Reviewed CT scan of 04/15/2016 was reviewed. IMPRESSION: 1. Enlarged appendix measuring up to 9 mm. No secondary signs of appendicitis. Findings equivocal for acute appendicitis. 2. Otherwise unremarkable CT of the abdomen and pelvis. These results will be called to the ordering clinician or representative by the Radiologist Assistant, and communication documented in the PACS or zVision Dashboard.  The patient has essentially no intra-abdominal body fat. No discernible separation of loops by fluid is noted. The ovaries are not visualized.  CBC obtained today showed a hemoglobin of 13.0 with an MCV of 93, white blood cell count of 6200 with a 76% polys,  13% lymphocytes this distribution. Platelet count of 234,000.  C-reactive protein was 0.1 g/dL.  Sedimentation rate: 5. .  Assessment    Abnormal abdominal imaging, unlikely acute appendicitis based on the week of symptoms present between onset and initial imaging.  Persistent right lower quadrant pain without clear etiology.  Plan    Patient has been making use of ibuprofen 400 mg twice a day. This will be increased 800 mg 3 times a day as needed.  We'll see how  she responds to the change in anti-inflammatory medications and make further follow-up from there.    This information has been scribed by Ples SpecterJessica Qualls CMA.     Earline MayotteByrnett, Jeffrey W 04/20/2016, 8:26 PM

## 2016-04-20 ENCOUNTER — Other Ambulatory Visit: Payer: Self-pay

## 2016-04-20 ENCOUNTER — Telehealth: Payer: Self-pay

## 2016-04-20 DIAGNOSIS — R1031 Right lower quadrant pain: Secondary | ICD-10-CM

## 2016-04-20 NOTE — Telephone Encounter (Signed)
-----   Message from Earline MayotteJeffrey W Byrnett, MD sent at 04/20/2016  3:27 AM EDT ----- Please notify the patient her labs were perfectly normal. No evidence of infection. I don't think antibiotics the answer at this time. Would like her to have a pelvic ultrasound/ transvaginal study to look at the right ovary. This needs to be done this week.  See if increasing the ibuprofen to 600-800 mg yesterday of any benefit.  Extend my apologies for not getting the results to her yesterday.  Thanks.

## 2016-04-20 NOTE — Telephone Encounter (Signed)
Spoke with the patient and notified her of labs and no evidence of infection. She states still having pain even with the increase in ibuprofen, she has been using the heating pad. She is scheduled for a pelvic and [facility]/22/16 at 5:00 pm. She will arrive at 4:45 pm and have a full bladder. The patient is aware of date, time, and instructions.

## 2016-04-21 ENCOUNTER — Ambulatory Visit (INDEPENDENT_AMBULATORY_CARE_PROVIDER_SITE_OTHER): Payer: BLUE CROSS/BLUE SHIELD | Admitting: General Surgery

## 2016-04-21 ENCOUNTER — Encounter
Admission: RE | Admit: 2016-04-21 | Discharge: 2016-04-21 | Disposition: A | Discharge status: Home / Self Care | Payer: BLUE CROSS/BLUE SHIELD | Source: Ambulatory Visit | Attending: General Surgery | Admitting: General Surgery

## 2016-04-21 ENCOUNTER — Encounter: Payer: Self-pay | Admitting: General Surgery

## 2016-04-21 VITALS — BP 121/68 | HR 64 | Resp 12 | Ht 66.0 in | Wt 131.0 lb

## 2016-04-21 DIAGNOSIS — K3580 Unspecified acute appendicitis: Secondary | ICD-10-CM | POA: Diagnosis present

## 2016-04-21 DIAGNOSIS — R1031 Right lower quadrant pain: Secondary | ICD-10-CM

## 2016-04-21 HISTORY — DX: Other reaction to spinal and lumbar puncture: G97.1

## 2016-04-21 HISTORY — DX: Family history of other specified conditions: Z84.89

## 2016-04-21 HISTORY — DX: Personal history of urinary calculi: Z87.442

## 2016-04-21 HISTORY — DX: Cardiac murmur, unspecified: R01.1

## 2016-04-21 MED ORDER — HYDROCODONE-ACETAMINOPHEN 5-325 MG PO TABS: 1.0000 | ORAL_TABLET | ORAL | 0 refills | Status: DC | PRN

## 2016-04-21 NOTE — Progress Notes (Signed)
Patient ID: Emily Hurst, female   DOB: [birth year], 51 y.o.   MRN: [id number]  Chief Complaint  Patient presents with  . Follow-up    Right lower abdominal pain    HPI Emily Hurst is a 51 y.o. female here today to follow up for her right lower abdominal pain.   Patient states the pain has not changed in intensity, still int he right lower quadrant. Worse with activity. No fever or chills. Moving bowels regularly, denies fevers. Eating ok, appetite decreased. She was sent home from work yesterday due to the pain.    Patient states she was prescribed Cipro and Flagyl on 04/08/16 and she finished on 04/19/16. This information had not been given when she was seen two days ago.    HPI  Past Medical History:  Diagnosis Date  . Chicken pox    34  . Family history of adverse reaction to anesthesia    pt is adopted, does not know family medical history.  Marland Kitchen Heart murmur    diagnosed at age 21.  Marland Kitchen History of kidney stones 2006  . Hypertension    pt is not being treated for hypertension, it is being watched.  . Migraine   . Spinal headache     Past Surgical History:  Procedure Laterality Date  . CESAREAN SECTION    . ENDOMETRIAL ABLATION    . TUBAL LIGATION      Family History  Problem Relation Age of Onset  . Adopted: Yes  . Deep vein thrombosis Son 22  . Healthy Son   . ADD / ADHD Son     Social History Social History  Substance Use Topics  . Smoking status: Never Smoker  . Smokeless tobacco: Never Used  . Alcohol use No    Allergies  Allergen Reactions  . Biaxin [Clarithromycin] Other (See Comments)  . Contrast Media [Iodinated Diagnostic Agents]     Pt is able to tolerate betadine and eat shrimp without a problem.  Radford Pax [Oxycodone-Acetaminophen]     Current Outpatient Prescriptions  Medication Sig Dispense Refill  . aspirin-acetaminophen-caffeine (EXCEDRIN MIGRAINE) 250-250-65 MG per tablet Take 1 tablet by mouth. AS NEEDED    . ibuprofen (ADVIL,MOTRIN)  200 MG tablet Take 200 mg by mouth.    . Multiple Vitamins-Minerals (WOMENS MULTIVITAMIN PLUS) TABS Take 1 tablet by mouth daily.    Marland Kitchen HYDROcodone-acetaminophen (NORCO) 5-325 MG tablet Take 1-2 tablets by mouth every 4 (four) hours as needed for moderate pain. 30 tablet 0   No current facility-administered medications for this visit.     Review of Systems Review of Systems  Constitutional: Negative.   HENT: Negative.   Cardiovascular: Negative.   Gastrointestinal: Positive for abdominal pain.    Blood pressure 121/68, pulse 64, resp. rate 12, height 5\' 6"  (1.676 m), weight 131 lb (59.4 kg).  Physical Exam Physical Exam  Constitutional: She is oriented to person, place, and time. She appears well-developed and well-nourished.  Cardiovascular: Normal rate, regular rhythm and normal heart sounds.   Pulmonary/Chest: Effort normal and breath sounds normal.  Abdominal: Soft. Bowel sounds are normal. Tenderness: lower right quadrant.  Neurological: She is alert and oriented to person, place, and time.  Skin: Skin is warm and dry.    Data Reviewed CBC obtained at the time of her original office visit on 04/19/2016, after 10 days of oral antibiotics, was normal with a hemoglobin of 13.0 and a white blood cell count of 6200. Sedimentation rate and C-reactive  protein were both normal.  Assessment    Persistent right lower quadrant pain, now entering the second week. Abnormal CT with dilatation of the appendix one week after the initiation of oral antibiotic therapy.    Plan    I think there is little likelihood that this is a GYN pathology, and have recommended that we consider diagnostic laparoscopy with appendectomy. The risks of the procedure have been reviewed including bleeding, infection, injury to adjacent organs and anesthesia risk.  Considering her persistent pain she was given a prescription for Norco which is tolerated well in the past used tonight if needed.    Discussed and  reviewed risk and options in detail with patient for laparoscopic surgery to remove appendix.  Patient amendable to proceed with surgery. [facility].  Will be out of work a couple of days next week.  This information has been scribed by Milas Kocherebeca Morris, CMA     Earline MayotteByrnett, Javoni Lucken W 04/21/2016, 1:31 PM

## 2016-04-21 NOTE — Patient Instructions (Signed)
Laparoscopic Appendectomy, Adult A laparoscopic appendectomy is a surgery to take out the appendix. The appendix is a finger-like structure that is attached to the large intestine. In this surgery, the appendix is removed through two or three small cuts (incisions) with the help of a thin, lighted tube that has a tiny camera on the end (laparoscope). A laparoscopic appendectomy may be done to prevent an inflamed appendix from bursting (rupturing). Or, it may be done to treat the infection from an appendix that has already ruptured. It is usually done immediately after inflammation of the appendix (appendicitis) is diagnosed. Tell a health care provider about:  Any allergies you have.  All medicines you are taking, including vitamins, herbs, eye drops, creams, and over-the-counter medicines.  Any problems you or family members have had with anesthetic medicines.  Any blood disorders you have.  Any surgeries you have had.  Any medical conditions you have.  Whether you are pregnant or may be pregnant. What are the risks? Generally, this is a safe procedure. However, problems may occur, including:  Infection.  Bleeding.  Allergic reactions to medicines.  Damage to other structures or organs.  The formation of collections of pus (abscesses).  Long-lasting pain or scarring at the incision sites or inside the abdomen.  Blood clots in the legs. What happens before the procedure?  Follow instructions from your health care provider about eating or drinking restrictions.  Ask your health care provider about:  Changing or stopping your regular medicines. This is especially important if you are taking diabetes medicines or blood thinners.  Taking medicines such as aspirin and ibuprofen. These medicines can thin your blood. Do not take these medicines before your procedure if your health care provider instructs you not to.  Ask your health care provider how your surgical site will be  marked or identified.  You may be given antibiotic medicine to help prevent infection or to treat existing inflammation or infection.  If you will be going home on the same day as your surgery, plan to have someone with you for 24 hours. What happens during the procedure?  To reduce your risk of infection:  Your health care team will wash or sanitize their hands.  Your skin will be washed with soap.  An IV tube will be inserted into one of your veins. You will receive medicine and fluids through this tube.  You will be given one or more of the following:  A medicine to help you relax (sedative).  A medicine to numb the area (local anesthetic).  A medicine to make you fall asleep (general anesthetic).  A medicine that is injected into your spine to numb the area below and slightly above the injection site (spinal anesthetic).  A medicine that is injected into an area of your body to numb everything below the injection site (regional anesthetic).  A thin, flexible tube (catheter) may be put into your bladder to drain urine.  A tube may be passed through your nose and into your stomach (NG tube, or nasogastric tube) to drain any stomach contents.  Your surgeon will make two or three small incisions near your belly button (navel).  Air-like gas will be used to fill your abdomen. The gas will make your abdomen expand. This helps the surgeon to see clearly and gives him or her more room to work.  A laparoscope will be passed through one of the incisions.  Other long, thin surgical instruments will be passed through the other incisions.  The appendix will be located and removed through one of the incisions.  The abdomen may be washed out to remove bacteria.  The incisions will be closed with stitches (sutures), staples, or adhesive strips.  A bandage (dressing) may be used to cover the incisions.  If a tube was inserted into your bladder or stomach, it will be removed. What  happens after the procedure?  Your blood pressure, heart rate, breathing rate, and blood oxygen level will be monitored often until the medicines you were given have worn off.  You will be given pain medicine as needed to keep you comfortable.  If your appendix did not rupture, you may be able to go home the same day as your surgery.  Do not drive for 24 hours if you received a sedative.  If your appendix ruptured:  You will get antibiotic medicine through an IV tube.  You may be sent home with a temporary drain. This information is not intended to replace advice given to you by your health care provider. Make sure you discuss any questions you have with your health care provider. Document Released: 09/01/2003 Document Revised: 06/25/2015 Document Reviewed: 07/07/2014 Elsevier Interactive Patient Education  2017 [employer]

## 2016-04-21 NOTE — Patient Instructions (Signed)
  Your procedure is scheduled on: Friday April 22, 2016. Report to Same Day Surgery at 7:30 am .   Remember: Instructions that are not followed completely may result in serious medical risk, up to and including death, or upon the discretion of your surgeon and anesthesiologist your surgery may need to be rescheduled.    _x___ 1. Do not eat food or drink liquids after midnight. No gum chewing or hard candies.     _x___ 2. No Alcohol for 24 hours before or after surgery.   ____ 3. Bring all medications with you on the day of surgery if instructed.    __x__ 4. Notify your doctor if there is any change in your medical condition     (cold, fever, infections).    _____ 5. No smoking 24 hours prior to surgery.     Do not wear jewelry, make-up, hairpins, clips or nail polish.  Do not wear lotions, powders, or perfumes.   Do not shave 48 hours prior to surgery. Men may shave face and neck.  Do not bring valuables to the hospital.    [facility] is not responsible for any belongings or valuables.               Contacts, dentures or bridgework may not be worn into surgery.  Leave your suitcase in the car. After surgery it may be brought to your room.  For patients admitted to the hospital, discharge time is determined by your treatment team.   Patients discharged the day of surgery will not be allowed to drive home.    Please read over the following fact sheets that you were given:   [facility]  ____ Take these medicines the morning of surgery with A SIP OF WATER: NONE    ____ Fleet Enema (as directed)   _x__ Use CHG Soap as directed on instruction sheet  ____ Use inhalers on the day of surgery and bring to hospital day of surgery  ____ Stop metformin 2 days prior to surgery    ____ Take 1/2 of usual insulin dose the night before surgery and none on the morning of surgery.   ____ Stop Coumadin/Plavix/aspirin on *does not apply.  _x___ Stop  Anti-inflammatories such as: EXCEDRIN MIGRAINE Advil, Aleve, Ibuprofen, Motrin, Naproxen, Naprosyn, Goodies powders or aspirin products. OK  to take Tylenol.   ____ Stop supplements until after surgery.    ____ Bring C-Pap to the hospital.

## 2016-04-22 ENCOUNTER — Ambulatory Visit
Admission: RE | Admit: 2016-04-22 | Discharge: 2016-04-22 | Disposition: A | Discharge status: Home / Self Care | Payer: BLUE CROSS/BLUE SHIELD | Source: Ambulatory Visit | Attending: General Surgery | Admitting: General Surgery

## 2016-04-22 ENCOUNTER — Encounter: Payer: Self-pay | Admitting: *Deleted

## 2016-04-22 ENCOUNTER — Encounter
Admission: RE | Disposition: A | Discharge status: Home / Self Care | Payer: Self-pay | Source: Ambulatory Visit | Attending: General Surgery

## 2016-04-22 ENCOUNTER — Ambulatory Visit: Payer: BLUE CROSS/BLUE SHIELD | Admitting: Anesthesiology

## 2016-04-22 ENCOUNTER — Ambulatory Visit: Admission: RE | Admit: 2016-04-22 | Payer: BLUE CROSS/BLUE SHIELD | Source: Ambulatory Visit

## 2016-04-22 DIAGNOSIS — K3580 Unspecified acute appendicitis: Secondary | ICD-10-CM | POA: Insufficient documentation

## 2016-04-22 DIAGNOSIS — R1031 Right lower quadrant pain: Secondary | ICD-10-CM

## 2016-04-22 HISTORY — PX: LAPAROSCOPIC APPENDECTOMY: SHX408

## 2016-04-22 LAB — POCT PREGNANCY, URINE: PREG TEST UR: NEGATIVE

## 2016-04-22 SURGERY — APPENDECTOMY, LAPAROSCOPIC
Anesthesia: General

## 2016-04-22 MED ORDER — SODIUM CHLORIDE 0.9 % IJ SOLN
INTRAMUSCULAR | Status: AC
  Filled 2016-04-22: qty 10

## 2016-04-22 MED ORDER — KETOROLAC TROMETHAMINE 30 MG/ML IJ SOLN
INTRAMUSCULAR | Status: AC
  Filled 2016-04-22: qty 1

## 2016-04-22 MED ORDER — FAMOTIDINE 20 MG PO TABS
ORAL_TABLET | ORAL | Status: AC
  Administered 2016-04-22: 20 mg via ORAL
  Filled 2016-04-22: qty 1

## 2016-04-22 MED ORDER — BUPIVACAINE HCL (PF) 0.5 % IJ SOLN
INTRAMUSCULAR | Status: DC | PRN
  Administered 2016-04-22: 16 mL

## 2016-04-22 MED ORDER — FENTANYL CITRATE (PF) 100 MCG/2ML IJ SOLN
25.0000 ug | INTRAMUSCULAR | Status: DC | PRN
  Administered 2016-04-22 (×4): 25 ug via INTRAVENOUS

## 2016-04-22 MED ORDER — ACETAMINOPHEN 10 MG/ML IV SOLN
INTRAVENOUS | Status: AC
  Filled 2016-04-22: qty 100

## 2016-04-22 MED ORDER — FAMOTIDINE 20 MG PO TABS
20.0000 mg | ORAL_TABLET | Freq: Once | ORAL | Status: AC
  Administered 2016-04-22: 20 mg via ORAL

## 2016-04-22 MED ORDER — FENTANYL CITRATE (PF) 100 MCG/2ML IJ SOLN
INTRAMUSCULAR | Status: AC
  Filled 2016-04-22: qty 2

## 2016-04-22 MED ORDER — SUGAMMADEX SODIUM 200 MG/2ML IV SOLN
INTRAVENOUS | Status: DC | PRN
  Administered 2016-04-22: 118.8 mg via INTRAVENOUS

## 2016-04-22 MED ORDER — HYDROCODONE-ACETAMINOPHEN 5-325 MG PO TABS
ORAL_TABLET | ORAL | Status: AC
  Filled 2016-04-22: qty 1

## 2016-04-22 MED ORDER — LIDOCAINE HCL (CARDIAC) 20 MG/ML IV SOLN
INTRAVENOUS | Status: DC | PRN
  Administered 2016-04-22: 30 mg via INTRAVENOUS

## 2016-04-22 MED ORDER — MIDAZOLAM HCL 2 MG/2ML IJ SOLN
INTRAMUSCULAR | Status: DC | PRN
  Administered 2016-04-22: 2 mg via INTRAVENOUS

## 2016-04-22 MED ORDER — DEXAMETHASONE SODIUM PHOSPHATE 10 MG/ML IJ SOLN
INTRAMUSCULAR | Status: AC
  Filled 2016-04-22: qty 1

## 2016-04-22 MED ORDER — SUGAMMADEX SODIUM 200 MG/2ML IV SOLN
INTRAVENOUS | Status: AC
  Filled 2016-04-22: qty 2

## 2016-04-22 MED ORDER — ROCURONIUM BROMIDE 100 MG/10ML IV SOLN
INTRAVENOUS | Status: DC | PRN
  Administered 2016-04-22: 15 mg via INTRAVENOUS
  Administered 2016-04-22: 10 mg via INTRAVENOUS
  Administered 2016-04-22: 30 mg via INTRAVENOUS

## 2016-04-22 MED ORDER — SCOPOLAMINE 1 MG/3DAYS TD PT72
1.0000 | MEDICATED_PATCH | Freq: Once | TRANSDERMAL | Status: DC
  Administered 2016-04-22: 1.5 mg via TRANSDERMAL

## 2016-04-22 MED ORDER — FENTANYL CITRATE (PF) 100 MCG/2ML IJ SOLN
INTRAMUSCULAR | Status: DC | PRN
  Administered 2016-04-22: 100 ug via INTRAVENOUS

## 2016-04-22 MED ORDER — MIDAZOLAM HCL 2 MG/2ML IJ SOLN
INTRAMUSCULAR | Status: AC
  Filled 2016-04-22: qty 2

## 2016-04-22 MED ORDER — FENTANYL CITRATE (PF) 100 MCG/2ML IJ SOLN
INTRAMUSCULAR | Status: AC
  Administered 2016-04-22: 25 ug via INTRAVENOUS
  Filled 2016-04-22: qty 2

## 2016-04-22 MED ORDER — PROPOFOL 10 MG/ML IV BOLUS
INTRAVENOUS | Status: DC | PRN
  Administered 2016-04-22: 130 mg via INTRAVENOUS

## 2016-04-22 MED ORDER — ONDANSETRON HCL 4 MG/2ML IJ SOLN
INTRAMUSCULAR | Status: DC | PRN
  Administered 2016-04-22: 4 mg via INTRAVENOUS

## 2016-04-22 MED ORDER — PROMETHAZINE HCL 25 MG/ML IJ SOLN
6.2500 mg | INTRAMUSCULAR | Status: DC | PRN
  Administered 2016-04-22: 6.25 mg via INTRAVENOUS

## 2016-04-22 MED ORDER — DEXAMETHASONE SODIUM PHOSPHATE 10 MG/ML IJ SOLN
INTRAMUSCULAR | Status: DC | PRN
  Administered 2016-04-22: 8 mg via INTRAVENOUS

## 2016-04-22 MED ORDER — KETOROLAC TROMETHAMINE 30 MG/ML IJ SOLN
INTRAMUSCULAR | Status: DC | PRN
  Administered 2016-04-22: 30 mg via INTRAVENOUS

## 2016-04-22 MED ORDER — BUPIVACAINE HCL (PF) 0.5 % IJ SOLN
INTRAMUSCULAR | Status: AC
  Filled 2016-04-22: qty 30

## 2016-04-22 MED ORDER — PROMETHAZINE HCL 25 MG/ML IJ SOLN
INTRAMUSCULAR | Status: AC
  Filled 2016-04-22: qty 1

## 2016-04-22 MED ORDER — LACTATED RINGERS IV SOLN
INTRAVENOUS | Status: DC
  Administered 2016-04-22 (×2): via INTRAVENOUS

## 2016-04-22 MED ORDER — HYDROCODONE-ACETAMINOPHEN 5-325 MG PO TABS
1.0000 | ORAL_TABLET | ORAL | Status: DC | PRN
  Administered 2016-04-22: 1 via ORAL

## 2016-04-22 MED ORDER — DEXTROSE 5 % IV SOLN
2.0000 g | INTRAVENOUS | Status: AC
  Administered 2016-04-22: 2 g via INTRAVENOUS
  Filled 2016-04-22: qty 2

## 2016-04-22 MED ORDER — SCOPOLAMINE 1 MG/3DAYS TD PT72
MEDICATED_PATCH | TRANSDERMAL | Status: AC
  Filled 2016-04-22: qty 1

## 2016-04-22 SURGICAL SUPPLY — 38 items
BLADE SURG 11 STRL SS SAFETY (MISCELLANEOUS) ×2 IMPLANT
CANISTER SUCT 1200ML W/VALVE (MISCELLANEOUS) IMPLANT
CANNULA DILATOR 10 W/SLV (CANNULA) ×4 IMPLANT
CATH TRAY 16F METER LATEX (MISCELLANEOUS) IMPLANT
CHLORAPREP W/TINT 26ML (MISCELLANEOUS) ×2 IMPLANT
CUTTER FLEX LINEAR 45M (STAPLE) ×2 IMPLANT
DRSG TEGADERM 2-3/8X2-3/4 SM (GAUZE/BANDAGES/DRESSINGS) ×6 IMPLANT
DRSG TELFA 4X3 1S NADH ST (GAUZE/BANDAGES/DRESSINGS) ×2 IMPLANT
ELECT REM PT RETURN 9FT ADLT (ELECTROSURGICAL) ×2
ELECTRODE REM PT RTRN 9FT ADLT (ELECTROSURGICAL) ×1 IMPLANT
GLOVE BIO SURGEON STRL SZ7.5 (GLOVE) ×6 IMPLANT
GLOVE INDICATOR 8.0 STRL GRN (GLOVE) ×6 IMPLANT
GOWN STRL REUS W/ TWL LRG LVL3 (GOWN DISPOSABLE) ×3 IMPLANT
GOWN STRL REUS W/TWL LRG LVL3 (GOWN DISPOSABLE) ×3
GRASPER SUT TROCAR 14GX15 (MISCELLANEOUS) ×2 IMPLANT
IRRIGATION STRYKERFLOW (MISCELLANEOUS) IMPLANT
IRRIGATOR STRYKERFLOW (MISCELLANEOUS)
IV LACTATED RINGERS 1000ML (IV SOLUTION) IMPLANT
KIT RM TURNOVER STRD PROC AR (KITS) ×2 IMPLANT
LABEL OR SOLS (LABEL) ×2 IMPLANT
NDL INSUFF ACCESS 14 VERSASTEP (NEEDLE) ×2 IMPLANT
NEEDLE HYPO 22GX1.5 SAFETY (NEEDLE) ×2 IMPLANT
NS IRRIG 1000ML POUR BTL (IV SOLUTION) IMPLANT
NS IRRIG 500ML POUR BTL (IV SOLUTION) ×2 IMPLANT
PACK LAP CHOLECYSTECTOMY (MISCELLANEOUS) ×2 IMPLANT
POUCH ENDO CATCH 10MM SPEC (MISCELLANEOUS) ×2 IMPLANT
RELOAD 45 VASCULAR/THIN (ENDOMECHANICALS) ×2 IMPLANT
RELOAD STAPLE TA45 3.5 REG BLU (ENDOMECHANICALS) ×2 IMPLANT
SEAL FOR SCOPE WARMER C3101 (MISCELLANEOUS) IMPLANT
STRIP CLOSURE SKIN 1/2X4 (GAUZE/BANDAGES/DRESSINGS) ×2 IMPLANT
SUT VIC AB 0 CT2 27 (SUTURE) ×2 IMPLANT
SUT VIC AB 4-0 FS2 27 (SUTURE) ×2 IMPLANT
SWABSTK COMLB BENZOIN TINCTURE (MISCELLANEOUS) ×2 IMPLANT
TROCAR VERSASTEP PLUS 5MM (TROCAR) ×4 IMPLANT
TROCAR XCEL 12X100 BLDLESS (ENDOMECHANICALS) IMPLANT
TROCAR XCEL BLUNT TIP 100MML (ENDOMECHANICALS) ×2 IMPLANT
TUBING INSUFFLATOR HI FLOW (MISCELLANEOUS) ×2 IMPLANT
WATER STERILE IRR 1000ML POUR (IV SOLUTION) ×2 IMPLANT

## 2016-04-22 NOTE — H&P (Signed)
No change in clinical history or exam. Did not use Norco last PM. Plan for lap appendectomy. Possibility of an open procedure reviewed.

## 2016-04-22 NOTE — Discharge Instructions (Signed)

## 2016-04-22 NOTE — Anesthesia Procedure Notes (Signed)
Procedure Name: Intubation Date/Time: 04/22/2016 9:00 AM Performed by: Junious SilkNOLES, Emin Foree Pre-anesthesia Checklist: Patient identified, Emergency Drugs available, Suction available, Patient being monitored and Timeout performed Patient Re-evaluated:Patient Re-evaluated prior to inductionOxygen Delivery Method: Circle system utilized Preoxygenation: Pre-oxygenation with 100% oxygen Intubation Type: IV induction Ventilation: Mask ventilation without difficulty Laryngoscope Size: 3 Grade View: Grade I Tube type: Oral Tube size: 7.0 mm Number of attempts: 1 Airway Equipment and Method: Stylet Placement Confirmation: ETT inserted through vocal cords under direct vision,  positive ETCO2 and breath sounds checked- equal and bilateral Secured at: 21 cm Dental Injury: Teeth and Oropharynx as per pre-operative assessment

## 2016-04-22 NOTE — Op Note (Signed)
Preoperative diagnosis: Appendicitis,   Postoperative diagnosis: Same.  Operative procedure: Diagnostic laparoscopy, appendectomy.  Operating surgeon: Lane HackerJeffery Myia Bergh, M.D.  Anesthesia: Gen. endotracheal, Marcaine 0.5%, plain, 16 mL local.  Past medical loss: Less than 2 mL.  Clinical note: This 51 year old woman has had a two-week history of abdominal pain. She was treated with a ten-day course of Flagyl and Cipro without improvement. CT scan obtained 1 week ago show dilatation of the appendix without evidence of periappendiceal information. She continued to have pain in the right lower quadrant and was brought to the operative for planned laparoscopy and appendectomy.  Operative note: The patient received cefotetan prior to procedure. The abdomen was prepped with ChloraPrep and draped after the induction of general endotracheal anesthesia. A trans-umbilical incision was made and the fascia opened. A 0 PDS a small stitch was placed followed by Hasson cannula.  A 5 mm port was placed in the hypogastrium and a second 5 mm port placed in left lateral abdominal wall. The uterus was unremarkable. Evidence of previous tubal ligation. The right ovary was unremarkable. No free fluid. There was no evidence of a Meckel's diverticulum on examination of the terminal ileum. There was noted to be thickening of the appendix without evidence of erythema or induration. The liver, gallbladder anterior surface of the stomach and duodenum were unremarkable.  A window was made in the mesoappendix to allow placement of a blue Endo GIA cartridge. The appendix was divided from the cecum with good hemostasis. The mesoappendix was divided with a vascular cartridge. Tiny bleeding points controlled with electrocautery. The appendix was placed in an Endo Catch bag and delivered to the umbilical port site. Pneumoperitoneum was reestablished. Good hemostasis was noted. A single band of adhesions from the omentum to the  umbilical  area were taken down with cautery. The abdomen was then desufflated and ports removed. the fascia at the umbilicus was approximated with 0 PDS figure-of-eight sutures. Skin incisions were closed with interrupted 4-0 Vicryl septic or sutures. Benzoin, Steri-Strips, Telfa and Tegaderm dressings were applied.  Patient tolerated procedure well and was taken recovery in stable condition.

## 2016-04-22 NOTE — Transfer of Care (Signed)
Immediate Anesthesia Transfer of Care Note  Patient: Emily PeeLisa R Hurst  Procedure(s) Performed: Procedure(s): APPENDECTOMY LAPAROSCOPIC (N/A)  Patient Location: PACU  Anesthesia Type:General  Level of Consciousness: awake  Airway & Oxygen Therapy: Patient Spontanous Breathing and Patient connected to face mask oxygen  Post-op Assessment: Report given to RN and Post -op Vital signs reviewed and stable  Post vital signs: Reviewed and stable  Last Vitals:  Vitals:   04/22/16 0742 04/22/16 1010  BP: (!) 170/121 (!) 143/94  Pulse: (!) 111 84  Resp: 16 15  Temp: 36.7 C 36.7 C    Last Pain:  Vitals:   04/22/16 0742  TempSrc: Tympanic  PainSc: 6          Complications: No apparent anesthesia complications

## 2016-04-22 NOTE — OR Nursing (Signed)
Dr. Lemar LivingsByrnett in to see and advises ok to d/c to home

## 2016-04-22 NOTE — Anesthesia Preprocedure Evaluation (Signed)
Anesthesia Evaluation  Patient identified by MRN, date of birth, ID band Patient awake    Reviewed: Allergy & Precautions, H&P , NPO status , Patient's Chart, lab work & pertinent test results, reviewed documented beta blocker date and time   History of Anesthesia Complications (+) POST - OP SPINAL HEADACHE, Family history of anesthesia reaction and history of anesthetic complications  Airway Mallampati: III  TM Distance: >3 FB Neck ROM: full    Dental  (+) Teeth Intact   Pulmonary neg pulmonary ROS,           Cardiovascular Exercise Tolerance: Good hypertension, (-) angina(-) CAD, (-) Past MI, (-) Cardiac Stents and (-) CABG negative cardio ROS  (-) dysrhythmias + Valvular Problems/Murmurs      Neuro/Psych negative neurological ROS  negative psych ROS   GI/Hepatic negative GI ROS, Neg liver ROS,   Endo/Other  negative endocrine ROS  Renal/GU negative Renal ROS  negative genitourinary   Musculoskeletal   Abdominal   Peds  Hematology negative hematology ROS (+) anemia ,   Anesthesia Other Findings Past Medical History: No date: Chicken pox     Comment: 34 No date: Family history of adverse reaction to anesthes*     Comment: pt is adopted, does not know family medical               history. No date: Heart murmur     Comment: diagnosed at age 51. 2006: History of kidney stones No date: Hypertension     Comment: pt is not being treated for hypertension, it               is being watched. No date: Migraine No date: Spinal headache   Reproductive/Obstetrics negative OB ROS                             Anesthesia Physical Anesthesia Plan  ASA: II  Anesthesia Plan: General   Post-op Pain Management:    Induction:   Airway Management Planned:   Additional Equipment:   Intra-op Plan:   Post-operative Plan:   Informed Consent: I have reviewed the patients History and  Physical, chart, labs and discussed the procedure including the risks, benefits and alternatives for the proposed anesthesia with the patient or authorized representative who has indicated his/her understanding and acceptance.   Dental Advisory Given  Plan Discussed with: Anesthesiologist, CRNA and Surgeon  Anesthesia Plan Comments:         Anesthesia Quick Evaluation

## 2016-04-22 NOTE — Anesthesia Post-op Follow-up Note (Cosign Needed)
Anesthesia QCDR form completed.        

## 2016-04-22 NOTE — Anesthesia Postprocedure Evaluation (Signed)
Anesthesia Post Note  [name]  Procedure(s) Performed: Procedure(s) (LRB): APPENDECTOMY LAPAROSCOPIC (N/A)  Patient location during evaluation: PACU Anesthesia Type: General Level of consciousness: awake and alert Pain management: pain level controlled Vital Signs Assessment: post-procedure vital signs reviewed and stable Respiratory status: spontaneous breathing, nonlabored ventilation, respiratory function stable and patient connected to nasal cannula oxygen Cardiovascular status: blood pressure returned to baseline and stable Postop Assessment: no signs of nausea or vomiting Anesthetic complications: no     Last Vitals:  Vitals:   04/22/16 1058 04/22/16 1208  BP: (!) 159/98 (!) 146/88  Pulse: 80 79  Resp:  16  Temp: 36.5 C     Last Pain:  Vitals:   04/22/16 1208  TempSrc:   PainSc: 5                  Lenard SimmerAndrew Pasty Manninen

## 2016-04-23 ENCOUNTER — Encounter: Payer: Self-pay | Admitting: General Surgery

## 2016-04-25 LAB — SURGICAL PATHOLOGY

## 2016-04-26 ENCOUNTER — Encounter: Payer: Self-pay | Admitting: General Surgery

## 2016-05-03 ENCOUNTER — Ambulatory Visit (INDEPENDENT_AMBULATORY_CARE_PROVIDER_SITE_OTHER): Payer: BLUE CROSS/BLUE SHIELD | Admitting: General Surgery

## 2016-05-03 ENCOUNTER — Encounter: Payer: Self-pay | Admitting: General Surgery

## 2016-05-03 VITALS — BP 124/86 | HR 92 | Resp 12 | Ht 66.5 in | Wt 128.0 lb

## 2016-05-03 DIAGNOSIS — K3589 Other acute appendicitis without perforation or gangrene: Secondary | ICD-10-CM

## 2016-05-03 DIAGNOSIS — R1031 Right lower quadrant pain: Secondary | ICD-10-CM

## 2016-05-03 NOTE — Patient Instructions (Addendum)
The patient is aware to call back for any questions or concerns. Patient to return as needed. 

## 2016-05-03 NOTE — Progress Notes (Signed)
Patient ID: Emily Hurst, female   DOB: [birth year], 51 y.o.   MRN: [id number]  Chief Complaint  Patient presents with  . Routine Post Op    HPI Emily Hurst is a 51 y.o. female.  Here today for postoperative visit, he states he is doing well. Denies any gastrointestinal issues, bowels moved with enema on Friday. Less pain medications since Sunday.   HPI  Past Medical History:  Diagnosis Date  . Chicken pox    34  . Family history of adverse reaction to anesthesia    pt is adopted, does not know family medical history.  Emily Hurst Heart murmur    diagnosed at age 23.  Emily Hurst History of kidney stones 2006  . Hypertension    pt is not being treated for hypertension, it is being watched.  . Migraine   . Spinal headache     Past Surgical History:  Procedure Laterality Date  . CESAREAN SECTION    . ENDOMETRIAL ABLATION    . LAPAROSCOPIC APPENDECTOMY N/A 04/22/2016   Procedure: APPENDECTOMY LAPAROSCOPIC;  Surgeon: Earline Mayotte, MD;  Location: [facility] ORS;  Service: General;  Laterality: N/A;  . TUBAL LIGATION      Family History  Problem Relation Age of Onset  . Adopted: Yes  . Deep vein thrombosis Son 22  . Healthy Son   . ADD / ADHD Son     Social History Social History  Substance Use Topics  . Smoking status: Never Smoker  . Smokeless tobacco: Never Used  . Alcohol use No    Allergies  Allergen Reactions  . Biaxin [Clarithromycin] Other (See Comments)  . Contrast Media [Iodinated Diagnostic Agents]     Pt is able to tolerate betadine and eat shrimp without a problem.  Radford Pax [Oxycodone-Acetaminophen]     Current Outpatient Prescriptions  Medication Sig Dispense Refill  . aspirin-acetaminophen-caffeine (EXCEDRIN MIGRAINE) 250-250-65 MG per tablet Take 1 tablet by mouth every 8 (eight) hours as needed (for migraine/headaches.). AS NEEDED    . ibuprofen (ADVIL,MOTRIN) 200 MG tablet Take 200 mg by mouth every 8 (eight) hours as needed (for pain).     . Multiple  Vitamins-Minerals (WOMENS MULTIVITAMIN PLUS) TABS Take 1 tablet by mouth daily.     No current facility-administered medications for this visit.     Review of Systems Review of Systems  Constitutional: Negative.   Respiratory: Negative.   Cardiovascular: Negative.     Blood pressure 124/86, pulse 92, resp. rate 12, height 5' 6.5" (1.689 m), weight 128 lb (58.1 kg).  Physical Exam Physical Exam  Constitutional: She is oriented to person, place, and time. She appears well-developed and well-nourished.  Cardiovascular: Normal rate, regular rhythm and normal heart sounds.   Pulmonary/Chest: Effort normal and breath sounds normal.  Abdominal: Normal appearance.  Incision site is clean and healing well.   Neurological: She is alert and oriented to person, place, and time.  Skin: Skin is warm and dry.  Psychiatric: Her behavior is normal.    Data Reviewed 04/22/2016 pathology:   A. APPENDIX; LAPAROSCOPIC APPENDECTOMY:  - PROMINENT LYMPHOID HYPERPLASIA WITH CHANGES CONSISTENT WITH EARLY  ACUTE APPENDICITIS.  - NEGATIVE FOR MALIGNANCY.    Assessment    Steady progress status post appendectomy.    Plan    The importance of increase her activity to improve her strength and endurance was reviewed.  She desires to have her colonoscopy completed through this office. We'll defer this until later in summer when she's regained  all her strength after this recent ordeal with her appendix.     Patient to return as needed. Put in recall for colonoscopy in August 2018.  Patient has been instructed to make use of Neosporin ointment to the area .  This information has been scribed by Dorathy Daft RN, BSN,BC.   Earline Mayotte 05/03/2016, 8:28 PM

## 2016-05-05 ENCOUNTER — Telehealth: Payer: Self-pay

## 2016-05-05 NOTE — Telephone Encounter (Signed)
Discussed the patient's concerns. Gallbladder was identified at the time of surgery with no evidence of cholecystitis.  Reassured as she is more active she may have individual aches and pains that should resolve over time.

## 2016-05-05 NOTE — Telephone Encounter (Signed)
Follow up call to patient. She had spoken with Dr Evette Cristal last night about pain in her side and had used Advil and a heating pad for this and it relieved the pain. She states that in the evening on 05/03/16 and 05/04/16 she had pain in the lower right rib area. She report no fever or chills. She states that is subsided about 2 hours after using the heating pad and taking Advil. She did not have a recurrence of this during the night or this morning. She is concerned about what this may be, if it is related to her surgery or not.

## 2016-05-26 ENCOUNTER — Other Ambulatory Visit: Payer: Self-pay | Admitting: Physician Assistant

## 2016-05-26 DIAGNOSIS — Z1231 Encounter for screening mammogram for malignant neoplasm of breast: Secondary | ICD-10-CM

## 2016-06-06 ENCOUNTER — Telehealth: Payer: Self-pay | Admitting: General Surgery

## 2016-06-06 NOTE — Telephone Encounter (Signed)
PATIENT CALLED AND WOULD LIKE A NOTE TO BE ABLE TO GO TO [employer]ESSAGE [employer] TO HAVE A MESSAGE FOR MOTHER'S DAY.SHE NEEDS A NOTE TO GIVE HER  APPROVAL.SHE HAD HER APPENDIX REMOVED ON 04-22-16 BY DR BYRNETT.PLEASE ADVISE.SHE WILL CALL Wednesday IF SHE HAS NOT HEARD ANYTHING.

## 2016-06-06 NOTE — Telephone Encounter (Signed)
OK to have a massage.

## 2016-06-07 ENCOUNTER — Encounter: Payer: Self-pay | Admitting: General Surgery

## 2016-06-13 ENCOUNTER — Encounter: Payer: Self-pay | Admitting: Physician Assistant

## 2016-06-13 ENCOUNTER — Ambulatory Visit
Admission: RE | Admit: 2016-06-13 | Discharge: 2016-06-13 | Disposition: A | Discharge status: Home / Self Care | Payer: BLUE CROSS/BLUE SHIELD | Source: Ambulatory Visit | Attending: Physician Assistant | Admitting: Physician Assistant

## 2016-06-13 ENCOUNTER — Ambulatory Visit (INDEPENDENT_AMBULATORY_CARE_PROVIDER_SITE_OTHER): Payer: BLUE CROSS/BLUE SHIELD | Admitting: Physician Assistant

## 2016-06-13 ENCOUNTER — Telehealth: Payer: Self-pay

## 2016-06-13 VITALS — BP 110/80 | HR 80 | Temp 97.8°F | Resp 16 | Ht 67.0 in | Wt 131.4 lb

## 2016-06-13 DIAGNOSIS — Z124 Encounter for screening for malignant neoplasm of cervix: Secondary | ICD-10-CM

## 2016-06-13 DIAGNOSIS — Z126 Encounter for screening for malignant neoplasm of bladder: Secondary | ICD-10-CM | POA: Diagnosis not present

## 2016-06-13 DIAGNOSIS — R030 Elevated blood-pressure reading, without diagnosis of hypertension: Secondary | ICD-10-CM | POA: Diagnosis not present

## 2016-06-13 DIAGNOSIS — Z114 Encounter for screening for human immunodeficiency virus [HIV]: Secondary | ICD-10-CM

## 2016-06-13 DIAGNOSIS — Z1231 Encounter for screening mammogram for malignant neoplasm of breast: Secondary | ICD-10-CM | POA: Insufficient documentation

## 2016-06-13 DIAGNOSIS — Z1211 Encounter for screening for malignant neoplasm of colon: Secondary | ICD-10-CM

## 2016-06-13 DIAGNOSIS — Z0000 Encounter for general adult medical examination without abnormal findings: Secondary | ICD-10-CM

## 2016-06-13 DIAGNOSIS — Z23 Encounter for immunization: Secondary | ICD-10-CM

## 2016-06-13 LAB — POCT URINALYSIS DIPSTICK
BILIRUBIN UA: NEGATIVE
Blood, UA: NEGATIVE
Glucose, UA: NEGATIVE
KETONES UA: NEGATIVE
LEUKOCYTES UA: NEGATIVE
NITRITE UA: NEGATIVE
PH UA: 6 (ref 5.0–8.0)
Protein, UA: NEGATIVE
Spec Grav, UA: 1.025 (ref 1.010–1.025)
Urobilinogen, UA: 0.2 E.U./dL

## 2016-06-13 NOTE — Telephone Encounter (Signed)
-----   Message from Margaretann LovelessJennifer M Burnette, PA-C sent at 06/13/2016 10:11 AM EDT ----- Normal mammogram. Repeat screening in one year.

## 2016-06-13 NOTE — Patient Instructions (Signed)
Health Maintenance, Female Adopting a healthy lifestyle and getting preventive care can go a long way to promote health and wellness. Talk with your health care provider about what schedule of regular examinations is right for you. This is a good chance for you to check in with your provider about disease prevention and staying healthy. In between checkups, there are plenty of things you can do on your own. Experts have done a lot of research about which lifestyle changes and preventive measures are most likely to keep you healthy. Ask your health care provider for more information. Weight and diet Eat a healthy diet  Be sure to include plenty of vegetables, fruits, low-fat dairy products, and lean protein.  Do not eat a lot of foods high in solid fats, added sugars, or salt.  Get regular exercise. This is one of the most important things you can do for your health.  Most adults should exercise for at least 150 minutes each week. The exercise should increase your heart rate and make you sweat (moderate-intensity exercise).  Most adults should also do strengthening exercises at least twice a week. This is in addition to the moderate-intensity exercise. Maintain a healthy weight  Body mass index (BMI) is a measurement that can be used to identify possible weight problems. It estimates body fat based on height and weight. Your health care provider can help determine your BMI and help you achieve or maintain a healthy weight.  For females 50 years of age and older:  A BMI below 18.5 is considered underweight.  A BMI of 18.5 to 24.9 is normal.  A BMI of 25 to 29.9 is considered overweight.  A BMI of 30 and above is considered obese. Watch levels of cholesterol and blood lipids  You should start having your blood tested for lipids and cholesterol at 50 years of age, then have this test every 5 years.  You may need to have your cholesterol levels checked more often if:  Your lipid or  cholesterol levels are high.  You are older than 50 years of age.  You are at high risk for heart disease. Cancer screening Lung Cancer  Lung cancer screening is recommended for adults 50-42 years old who are at high risk for lung cancer because of a history of smoking.  A yearly low-dose CT scan of the lungs is recommended for people who:  Currently smoke.  Have quit within the past 15 years.  Have at least a 30-pack-year history of smoking. A pack year is smoking an average of one pack of cigarettes a day for 1 year.  Yearly screening should continue until it has been 15 years since you quit.  Yearly screening should stop if you develop a health problem that would prevent you from having lung cancer treatment. Breast Cancer  Practice breast self-awareness. This means understanding how your breasts normally appear and feel.  It also means doing regular breast self-exams. Let your health care provider know about any changes, no matter how small.  If you are in your 50s or 30s, you should have a clinical breast exam (CBE) by a health care provider every 1-3 years as part of a regular health exam.  If you are 50 or older, have a CBE every year. Also consider having a breast X-ray (mammogram) every year.  If you have a family history of breast cancer, talk to your health care provider about genetic screening.  If you are at high risk for breast cancer, talk  to your health care provider about having an MRI and a mammogram every year.  Breast cancer gene (BRCA) assessment is recommended for women who have family members with BRCA-related cancers. BRCA-related cancers include:  Breast.  Ovarian.  Tubal.  Peritoneal cancers.  Results of the assessment will determine the need for genetic counseling and BRCA1 and BRCA2 testing. Cervical Cancer  Your health care provider may recommend that you be screened regularly for cancer of the pelvic organs (ovaries, uterus, and vagina).  This screening involves a pelvic examination, including checking for microscopic changes to the surface of your cervix (Pap test). You may be encouraged to have this screening done every 3 years, beginning at age 50.  For women ages 50-65, health care providers may recommend pelvic exams and Pap testing every 3 years, or they may recommend the Pap and pelvic exam, combined with testing for human papilloma virus (HPV), every 5 years. Some types of HPV increase your risk of cervical cancer. Testing for HPV may also be done on women of any age with unclear Pap test results.  Other health care providers may not recommend any screening for nonpregnant women who are considered low risk for pelvic cancer and who do not have symptoms. Ask your health care provider if a screening pelvic exam is right for you.  If you have had past treatment for cervical cancer or a condition that could lead to cancer, you need Pap tests and screening for cancer for at least 20 years after your treatment. If Pap tests have been discontinued, your risk factors (such as having a new sexual partner) need to be reassessed to determine if screening should resume. Some women have medical problems that increase the chance of getting cervical cancer. In these cases, your health care provider may recommend more frequent screening and Pap tests. Colorectal Cancer  This type of cancer can be detected and often prevented.  Routine colorectal cancer screening usually begins at 50 years of age and continues through 50 years of age.  Your health care provider may recommend screening at an earlier age if you have risk factors for colon cancer.  Your health care provider may also recommend using home test kits to check for hidden blood in the stool.  A small camera at the end of a tube can be used to examine your colon directly (sigmoidoscopy or colonoscopy). This is done to check for the earliest forms of colorectal cancer.  Routine  screening usually begins at age 50.  Direct examination of the colon should be repeated every 5-10 years through 50 years of age. However, you may need to be screened more often if early forms of precancerous polyps or small growths are found. Skin Cancer  Check your skin from head to toe regularly.  Tell your health care provider about any new moles or changes in moles, especially if there is a change in a mole's shape or color.  Also tell your health care provider if you have a mole that is larger than the size of a pencil eraser.  Always use sunscreen. Apply sunscreen liberally and repeatedly throughout the day.  Protect yourself by wearing long sleeves, pants, a wide-brimmed hat, and sunglasses whenever you are outside. Heart disease, diabetes, and high blood pressure  High blood pressure causes heart disease and increases the risk of stroke. High blood pressure is more likely to develop in:  People who have blood pressure in the high end of the normal range (130-139/85-89 mm Hg).  People who are overweight or obese.  People who are African American.  If you are 59-24 years of age, have your blood pressure checked every 3-5 years. If you are 34 years of age or older, have your blood pressure checked every year. You should have your blood pressure measured twice-once when you are at a hospital or clinic, and once when you are not at a hospital or clinic. Record the average of the two measurements. To check your blood pressure when you are not at a hospital or clinic, you can use:  An automated blood pressure machine at a pharmacy.  A home blood pressure monitor.  If you are between 29 years and 60 years old, ask your health care provider if you should take aspirin to prevent strokes.  Have regular diabetes screenings. This involves taking a blood sample to check your fasting blood sugar level.  If you are at a normal weight and have a low risk for diabetes, have this test once  every three years after 50 years of age.  If you are overweight and have a high risk for diabetes, consider being tested at a younger age or more often. Preventing infection Hepatitis B  If you have a higher risk for hepatitis B, you should be screened for this virus. You are considered at high risk for hepatitis B if:  You were born in a country where hepatitis B is common. Ask your health care provider which countries are considered high risk.  Your parents were born in a high-risk country, and you have not been immunized against hepatitis B (hepatitis B vaccine).  You have HIV or AIDS.  You use needles to inject street drugs.  You live with someone who has hepatitis B.  You have had sex with someone who has hepatitis B.  You get hemodialysis treatment.  You take certain medicines for conditions, including cancer, organ transplantation, and autoimmune conditions. Hepatitis C  Blood testing is recommended for:  Everyone born from [birth year] through 1964.  Anyone with known risk factors for hepatitis C. Sexually transmitted infections (STIs)  You should be screened for sexually transmitted infections (STIs) including gonorrhea and chlamydia if:  You are sexually active and are younger than 50 years of age.  You are older than 50 years of age and your health care provider tells you that you are at risk for this type of infection.  Your sexual activity has changed since you were last screened and you are at an increased risk for chlamydia or gonorrhea. Ask your health care provider if you are at risk.  If you do not have HIV, but are at risk, it may be recommended that you take a prescription medicine daily to prevent HIV infection. This is called pre-exposure prophylaxis (PrEP). You are considered at risk if:  You are sexually active and do not regularly use condoms or know the HIV status of your partner(s).  You take drugs by injection.  You are sexually active with a partner  who has HIV. Talk with your health care provider about whether you are at high risk of being infected with HIV. If you choose to begin PrEP, you should first be tested for HIV. You should then be tested every 3 months for as long as you are taking PrEP. Pregnancy  If you are premenopausal and you may become pregnant, ask your health care provider about preconception counseling.  If you may become pregnant, take 400 to 800 micrograms (mcg) of folic acid  every day.  If you want to prevent pregnancy, talk to your health care provider about birth control (contraception). Osteoporosis and menopause  Osteoporosis is a disease in which the bones lose minerals and strength with aging. This can result in serious bone fractures. Your risk for osteoporosis can be identified using a bone density scan.  If you are 4 years of age or older, or if you are at risk for osteoporosis and fractures, ask your health care provider if you should be screened.  Ask your health care provider whether you should take a calcium or vitamin D supplement to lower your risk for osteoporosis.  Menopause may have certain physical symptoms and risks.  Hormone replacement therapy may reduce some of these symptoms and risks. Talk to your health care provider about whether hormone replacement therapy is right for you. Follow these instructions at home:  Schedule regular health, dental, and eye exams.  Stay current with your immunizations.  Do not use any tobacco products including cigarettes, chewing tobacco, or electronic cigarettes.  If you are pregnant, do not drink alcohol.  If you are breastfeeding, limit how much and how often you drink alcohol.  Limit alcohol intake to no more than 1 drink per day for nonpregnant women. One drink equals 12 ounces of beer, 5 ounces of wine, or 1 ounces of hard liquor.  Do not use street drugs.  Do not share needles.  Ask your health care provider for help if you need support  or information about quitting drugs.  Tell your health care provider if you often feel depressed.  Tell your health care provider if you have ever been abused or do not feel safe at home. This information is not intended to replace advice given to you by your health care provider. Make sure you discuss any questions you have with your health care provider. Document Released: 08/02/2010 Document Revised: 06/25/2015 Document Reviewed: 10/21/2014 Elsevier Interactive Patient Education  2017 [employer].

## 2016-06-13 NOTE — Progress Notes (Signed)
[name], Female    DOB: [birth year], 51 y.o.   MRN: [id number] Visit Date: 06/13/2016  Today's Provider: Margaretann LovelessJennifer M Izabellah Dadisman, PA-C   Chief Complaint  Patient presents with  . Annual Exam   Subjective:    Annual physical exam Emily BackerLisa R Hurst is a 51 y.o. female who presents today for health maintenance and complete physical. She feels well. She reports exercising active with daily activities. She reports she is sleeping well.  06/10/15 CPE 03/08/13 Pap-neg; HPV-neg 06/13/16 Mammogram-no results yet at this time Colonoscopy- will be scheduled with Dr. Lemar LivingsByrnett -----------------------------------------------------------------   Review of Systems  Constitutional: Negative.   HENT: Negative.   Eyes: Negative.   Respiratory: Negative.   Cardiovascular: Negative.   Gastrointestinal: Positive for blood in stool.  Endocrine: Negative.   Genitourinary: Negative.   Musculoskeletal: Negative.   Skin: Negative.   Allergic/Immunologic: Negative.   Neurological: Negative.   Hematological: Negative.   Psychiatric/Behavioral: Negative.     Social History      She  reports that she has never smoked. She has never used smokeless tobacco. She reports that she does not drink alcohol or use drugs.       Social History   Social History  . Marital status: Single    Spouse name: N/A  . Number of children: 3  . Years of education: N/A   Social History Main Topics  . Smoking status: Never Smoker  . Smokeless tobacco: Never Used  . Alcohol use No  . Drug use: No  . Sexual activity: No   Other Topics Concern  . None   Social History Narrative  . None    Past Medical History:  Diagnosis Date  . Chicken pox    34  . Family history of adverse reaction to anesthesia    pt is adopted, does not know family medical history.  Marland Kitchen. Heart murmur    diagnosed at age 89.  Marland Kitchen. History of kidney stones 2006  . Hypertension    pt is not being treated for hypertension,  it is being watched.  . Migraine   . Spinal headache      Patient Active Problem List   Diagnosis Date Noted  . Other acute appendicitis 05/03/2016  . Upper respiratory infection 03/27/2015  . Encounter for screening mammogram for breast cancer 10/28/2014  . Body water dehydration 09/29/2014  . Early menopause 09/29/2014  . Vitamin D deficiency 09/25/2014    Past Surgical History:  Procedure Laterality Date  . CESAREAN SECTION    . ENDOMETRIAL ABLATION    . LAPAROSCOPIC APPENDECTOMY N/A 04/22/2016   Procedure: APPENDECTOMY LAPAROSCOPIC;  Surgeon: Earline MayotteJeffrey W Byrnett, MD;  Location: [facility] ORS;  Service: General;  Laterality: N/A;  . TUBAL LIGATION      Family History        Family Status  Relation Status  . Son Alive  . Son Alive  . Son Alive       develomental delay        Her family history includes ADD / ADHD in her son; Deep vein thrombosis (age of onset: 10422) in her son; Healthy in her son. She was adopted.     Allergies  Allergen Reactions  . Biaxin [Clarithromycin] Other (See Comments)  . Contrast Media [Iodinated Diagnostic Agents]     Pt is able to tolerate betadine and eat shrimp without a problem.  Radford Pax. Percocet [Oxycodone-Acetaminophen]      Current Outpatient Prescriptions:  .  aspirin-acetaminophen-caffeine (EXCEDRIN MIGRAINE) 250-250-65 MG per tablet, Take 1 tablet by mouth every 8 (eight) hours as needed (for migraine/headaches.). AS NEEDED, Disp: , Rfl:  .  ibuprofen (ADVIL,MOTRIN) 200 MG tablet, Take 200 mg by mouth every 8 (eight) hours as needed (for pain). , Disp: , Rfl:  .  Multiple Vitamins-Minerals (WOMENS MULTIVITAMIN PLUS) TABS, Take 1 tablet by mouth daily., Disp: , Rfl:    Patient Care Team: Emily Loveless, PA-C as PCP - General (Family Medicine) Malva Limes, MD as Referring Physician (Family Medicine) Earline Mayotte, MD (General Surgery)      Objective:   Vitals: BP 110/80 (BP Location: Left Arm, Patient Position: Sitting,  Cuff Size: Normal)   Pulse 80   Temp 97.8 F (36.6 C) (Oral)   Resp 16   Ht 5\' 7"  (1.702 m)   Wt 131 lb 6.4 oz (59.6 kg)   BMI 20.58 kg/m    Vitals:   06/13/16 0913  BP: 110/80  Pulse: 80  Resp: 16  Temp: 97.8 F (36.6 C)  TempSrc: Oral  Weight: 131 lb 6.4 oz (59.6 kg)  Height: 5\' 7"  (1.702 m)     Physical Exam  Constitutional: She is oriented to person, place, and time. She appears well-developed and well-nourished. No distress.  HENT:  Head: Normocephalic and atraumatic.  Right Ear: Hearing, tympanic membrane, external ear and ear canal normal.  Left Ear: Hearing, tympanic membrane, external ear and ear canal normal.  Nose: Nose normal.  Mouth/Throat: Uvula is midline, oropharynx is clear and moist and mucous membranes are normal. No oropharyngeal exudate.  Eyes: Conjunctivae and EOM are normal. Pupils are equal, round, and reactive to light. Right eye exhibits no discharge. Left eye exhibits no discharge. No scleral icterus.  Neck: Normal range of motion. Neck supple. No JVD present. Carotid bruit is not present. No tracheal deviation present. No thyromegaly present.  Cardiovascular: Normal rate, regular rhythm, normal heart sounds and intact distal pulses.  Exam reveals no gallop and no friction rub.   No murmur heard. Pulmonary/Chest: Effort normal and breath sounds normal. No respiratory distress. She has no wheezes. She has no rales. She exhibits no tenderness. Right breast exhibits no inverted nipple, no mass, no nipple discharge, no skin change and no tenderness. Left breast exhibits no inverted nipple, no mass, no nipple discharge, no skin change and no tenderness. Breasts are symmetrical.  Abdominal: Soft. Bowel sounds are normal. She exhibits no distension and no mass. There is no tenderness. There is no rebound and no guarding. Hernia confirmed negative in the right inguinal area and confirmed negative in the left inguinal area.  Genitourinary: Rectum normal, vagina  normal and uterus normal. No breast swelling, tenderness, discharge or bleeding. Pelvic exam was performed with patient supine. There is no rash, tenderness, lesion or injury on the right labia. There is no rash, tenderness, lesion or injury on the left labia. Cervix exhibits no motion tenderness, no discharge and no friability. Right adnexum displays no mass, no tenderness and no fullness. Left adnexum displays no mass, no tenderness and no fullness. No erythema, tenderness or bleeding in the vagina. No signs of injury around the vagina. No vaginal discharge found.  Musculoskeletal: Normal range of motion. She exhibits no edema or tenderness.  Lymphadenopathy:    She has no cervical adenopathy.       Right: No inguinal adenopathy present.       Left: No inguinal adenopathy present.  Neurological: She is alert and oriented  to person, place, and time. She has normal reflexes. No cranial nerve deficit. Coordination normal.  Skin: Skin is warm and dry. No rash noted. She is not diaphoretic.  Psychiatric: She has a normal mood and affect. Her behavior is normal. Judgment and thought content normal.  Vitals reviewed.   Depression Screen PHQ 2/9 Scores 06/13/2016 06/10/2015  PHQ - 2 Score 0 1  PHQ- 9 Score 0 -     Assessment & Plan:     Routine Health Maintenance and Physical Exam  Exercise Activities and Dietary recommendations Goals    . Exercise 150 minutes per week (moderate activity)       Immunization History  Administered Date(s) Administered  . Td 06/29/2006  . Tdap 06/29/2006    Health Maintenance  Topic Date Due  . HIV Screening  03/18/1981  . PAP SMEAR  03/08/2016  . COLONOSCOPY  03/18/2016  . TETANUS/TDAP  06/28/2016  . INFLUENZA VACCINE  08/31/2016  . MAMMOGRAM  11/04/2016     Discussed health benefits of physical activity, and encouraged her to engage in regular exercise appropriate for her age and condition.    1. Annual physical exam Normal physical exam  today. Will check labs as below and f/u pending lab results. If labs are stable and WNL she will not need to have these rechecked for one year at her next annual physical exam. She is to call the office in the meantime if she has any acute issue, questions or concerns. - CBC with Differential/Platelet - Comprehensive metabolic panel - Lipid Panel With LDL/HDL Ratio - TSH  2. Cervical cancer screening Pap collected today. Will send as below and f/u pending results. - Pap IG and HPV (high risk) DNA detection  3. Colon cancer screening Patient is already established with Dr. Lemar Livings and is having colonoscopy in August sometime per patient.   4. Elevated blood-pressure reading without diagnosis of hypertension BP WNL today. She is doing better with water intake. Will check labs as below and f/u pending results. - Lipid Panel With LDL/HDL Ratio  5. Bladder cancer screen UA normal.  6. Screening for HIV (human immunodeficiency virus) - HIV antibody  7. Need for Td vaccine Tetanus booster Vaccine given to patient without complications. Patient sat for 15 minutes after administration and was tolerated well without adverse effects. - Td vaccine greater than or equal to 7yo preservative free IM  --------------------------------------------------------------------    Emily Loveless, PA-C  [facility]

## 2016-06-13 NOTE — Telephone Encounter (Signed)
Patient advised as directed below.  Thanks,  -Joseline 

## 2016-06-14 ENCOUNTER — Telehealth: Payer: Self-pay

## 2016-06-14 LAB — COMPREHENSIVE METABOLIC PANEL
A/G RATIO: 1.7 (ref 1.2–2.2)
ALT: 18 IU/L (ref 0–32)
AST: 22 IU/L (ref 0–40)
Albumin: 4.8 g/dL (ref 3.5–5.5)
Alkaline Phosphatase: 68 IU/L (ref 39–117)
BUN/Creatinine Ratio: 14 (ref 9–23)
BUN: 14 mg/dL (ref 6–24)
Bilirubin Total: 0.4 mg/dL (ref 0.0–1.2)
CALCIUM: 9.3 mg/dL (ref 8.7–10.2)
CHLORIDE: 100 mmol/L (ref 96–106)
CO2: 24 mmol/L (ref 18–29)
Creatinine, Ser: 1.03 mg/dL — ABNORMAL HIGH (ref 0.57–1.00)
GFR, EST AFRICAN AMERICAN: 73 mL/min/{1.73_m2} (ref >59)
GFR, EST NON AFRICAN AMERICAN: 64 mL/min/{1.73_m2} (ref >59)
Globulin, Total: 2.8 g/dL (ref 1.5–4.5)
Glucose: 100 mg/dL — ABNORMAL HIGH (ref 65–99)
Potassium: 4.2 mmol/L (ref 3.5–5.2)
Sodium: 141 mmol/L (ref 134–144)
TOTAL PROTEIN: 7.6 g/dL (ref 6.0–8.5)

## 2016-06-14 LAB — LIPID PANEL WITH LDL/HDL RATIO
Cholesterol, Total: 219 mg/dL — ABNORMAL HIGH (ref 100–199)
HDL: 90 mg/dL (ref >39)
LDL Calculated: 122 mg/dL — ABNORMAL HIGH (ref 0–99)
LDL/HDL RATIO: 1.4 ratio (ref 0.0–3.2)
Triglycerides: 36 mg/dL (ref 0–149)
VLDL CHOLESTEROL CAL: 7 mg/dL (ref 5–40)

## 2016-06-14 LAB — CBC WITH DIFFERENTIAL/PLATELET
BASOS: 1 %
Basophils Absolute: 0 10*3/uL (ref 0.0–0.2)
EOS (ABSOLUTE): 0.1 10*3/uL (ref 0.0–0.4)
Eos: 1 %
Hematocrit: 37 % (ref 34.0–46.6)
Hemoglobin: 12.4 g/dL (ref 11.1–15.9)
IMMATURE GRANULOCYTES: 0 %
Immature Grans (Abs): 0 10*3/uL (ref 0.0–0.1)
LYMPHS: 16 %
Lymphocytes Absolute: 1 10*3/uL (ref 0.7–3.1)
MCH: 30.9 pg (ref 26.6–33.0)
MCHC: 33.5 g/dL (ref 31.5–35.7)
MCV: 92 fL (ref 79–97)
Monocytes Absolute: 0.4 10*3/uL (ref 0.1–0.9)
Monocytes: 7 %
NEUTROS PCT: 75 %
Neutrophils Absolute: 4.7 10*3/uL (ref 1.4–7.0)
PLATELETS: 252 10*3/uL (ref 150–379)
RBC: 4.01 x10E6/uL (ref 3.77–5.28)
RDW: 13.5 % (ref 12.3–15.4)
WBC: 6.1 10*3/uL (ref 3.4–10.8)

## 2016-06-14 LAB — TSH: TSH: 1.69 u[IU]/mL (ref 0.450–4.500)

## 2016-06-14 LAB — HIV ANTIBODY (ROUTINE TESTING W REFLEX): HIV SCREEN 4TH GENERATION: NONREACTIVE

## 2016-06-14 NOTE — Telephone Encounter (Signed)
-----   Message from Margaretann LovelessJennifer M Burnette, [place]-C sent at 06/14/2016 10:07 AM EDT ----- Cholesterol is still borderline high but has improved since last year. All other labs are stable and WNL. Make sure to push fluids and stay hydrated. Continue healthy lifestyle modifications with dieting and exercise as you have been doing.

## 2016-06-14 NOTE — Telephone Encounter (Signed)
Patient advised as directed below. She is asking about her pap results. I see it was ordered. Told patient that we have not receive them yet.

## 2016-06-14 NOTE — Telephone Encounter (Signed)
Pap has not resulted yet. It normally takes 48-72 hours. I have seen them rarely take up to 7 days. I will notify once they are received.

## 2016-06-15 LAB — PAP IG AND HPV HIGH-RISK
HPV, high-risk: NEGATIVE
PAP Smear Comment: 0

## 2016-06-16 ENCOUNTER — Telehealth: Payer: Self-pay

## 2016-06-16 NOTE — Telephone Encounter (Signed)
Patient advised as below.  

## 2016-06-16 NOTE — Telephone Encounter (Signed)
-----   Message from Jennifer MMargaretann Loveless Burnette, [place] sent at 06/15/2016  4:44 PM EDT ----- Pap is normal, HPV negative.  Will repeat in 3-5 years.

## 2016-07-18 ENCOUNTER — Telehealth: Payer: Self-pay | Admitting: *Deleted

## 2016-07-18 NOTE — Telephone Encounter (Signed)
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 She is concern that this could be from the surgery she had.

## 2016-07-18 NOTE — Telephone Encounter (Signed)
Spoke with the patient and she states that the pain comes and goes and only last for a very short while, 5-10 seconds. She reports that it is about a 3 on pain scale. She reports no shortness of breath. She states the pain does not change with activity or position. Patient instructed to monitor the pain and see if it is related to activity or diet and to call if it still continues in the next 1-2 weeks and we will have her seen.

## 2016-09-06 ENCOUNTER — Ambulatory Visit: Payer: BLUE CROSS/BLUE SHIELD | Admitting: General Surgery

## 2016-09-08 ENCOUNTER — Encounter: Payer: Self-pay | Admitting: General Surgery

## 2016-09-08 ENCOUNTER — Ambulatory Visit (INDEPENDENT_AMBULATORY_CARE_PROVIDER_SITE_OTHER): Payer: 59 | Admitting: General Surgery

## 2016-09-08 VITALS — BP 122/82 | HR 70 | Resp 12 | Ht 67.0 in | Wt 136.0 lb

## 2016-09-08 DIAGNOSIS — Z1211 Encounter for screening for malignant neoplasm of colon: Secondary | ICD-10-CM | POA: Diagnosis not present

## 2016-09-08 NOTE — Progress Notes (Signed)
Patient ID: Emily Hurst, female   DOB: 01-03-1966, 51 y.o.   MRN: [id number]  Chief Complaint  Patient presents with  . Colonoscopy    HPI Emily BackerLisa R Catania is a 51 y.o. female Here today for a evaluation of a screening colonoscopy. Patient states she is doing well, no GI problems at this time. Move her bowels every three days. Patient and she states that the pain comes and goes and only last for a very short while, 5-10 seconds. She reports that it is about a 3 on pain scale. She reports no shortness of breath. She states the pain does not change with activity or position.Patient states her son has protein S. HPI  Past Medical History:  Diagnosis Date  . Chicken pox    34  . Family history of adverse reaction to anesthesia    pt is adopted, does not know family medical history.  Marland Kitchen. Heart murmur    diagnosed at age 51.  Marland Kitchen. History of kidney stones 2006  . Hypertension    pt is not being treated for hypertension, it is being watched.  . Migraine   . Spinal headache     Past Surgical History:  Procedure Laterality Date  . CESAREAN SECTION    . ENDOMETRIAL ABLATION    . LAPAROSCOPIC APPENDECTOMY N/A 04/22/2016   Procedure: APPENDECTOMY LAPAROSCOPIC;  Surgeon: Earline MayotteJeffrey W Burnell Matlin, MD;  Location: [facility] ORS;  Service: General;  Laterality: N/A;  . TUBAL LIGATION      Family History  Problem Relation Age of Onset  . Adopted: Yes  . Deep vein thrombosis Son 22  . Healthy Son   . ADD / ADHD Son     Social History Social History  Substance Use Topics  . Smoking status: Never Smoker  . Smokeless tobacco: Never Used  . Alcohol use No    Allergies  Allergen Reactions  . Biaxin [Clarithromycin] Other (See Comments)  . Contrast Media [Iodinated Diagnostic Agents]     Pt is able to tolerate betadine and eat shrimp without a problem.  Radford Pax. Percocet [Oxycodone-Acetaminophen]     Current Outpatient Prescriptions  Medication Sig Dispense Refill  . aspirin-acetaminophen-caffeine  (EXCEDRIN MIGRAINE) 250-250-65 MG per tablet Take 1 tablet by mouth every 8 (eight) hours as needed (for migraine/headaches.). AS NEEDED    . ibuprofen (ADVIL,MOTRIN) 200 MG tablet Take 200 mg by mouth every 8 (eight) hours as needed (for pain).      No current facility-administered medications for this visit.     Review of Systems Review of Systems  Constitutional: Negative.   Respiratory: Negative.   Cardiovascular: Negative.   Gastrointestinal: Negative.     Blood pressure 122/82, pulse 70, resp. rate 12, height 5\' 7"  (1.702 m), weight 136 lb (61.7 kg).  Physical Exam Physical Exam  Constitutional: She is oriented to person, place, and time. She appears well-developed and well-nourished.  Cardiovascular: Normal rate, regular rhythm and normal heart sounds.   Pulmonary/Chest: Effort normal and breath sounds normal.  Abdominal: Soft. Bowel sounds are normal. There is no tenderness.  Neurological: She is alert and oriented to person, place, and time.  Skin: Skin is warm and dry.    Data Reviewed 04/15/2016 CT scan for abdominal pain: IMPRESSION: 1. Enlarged appendix measuring up to 9 mm. No secondary signs of appendicitis. Findings equivocal for acute appendicitis.  eview of these films shows no significant evidence of diverticulitis.  04/22/2016 appendectomy pathology: DIAGNOSIS:  A. APPENDIX; LAPAROSCOPIC APPENDECTOMY:  - PROMINENT LYMPHOID  HYPERPLASIA WITH CHANGES CONSISTENT WITH EARLY  ACUTE APPENDICITIS.  - NEGATIVE FOR MALIGNANCY.  06/13/2016 CBC and comprehensive metabolic panel are unremarkable. Hemoglobin 12.4 with an MCV of 92 and white blood cell count 6100. Fasting blood sugar 100, creatinine 1.0 with an estimated GFR of 64. Normal lectrolytes and liverfunction studies.  Assessment    Candidate for screening colonoscopy.    Plan    The patient will contact the office to report what particular protein deficiency her son had which resulted multiple blood  clots and pulmonary emboli. She will need to be tested to determine if she also is at risk. Not so much an issue for her present colonoscopy, but certainly for future procedures. Of note, she had no difficulty with her appendectomy earlier this year.    Colonoscopy with possible biopsy/polypectomy prn: Information regarding the procedure, including its potential risks and complications (including but not limited to perforation of the bowel, which may require emergency surgery to repair, and bleeding) was verbally given to the patient. Educational information regarding lower intestinal endoscopy was given to the patient. Written instructions for how to complete the bowel prep using Miralax were provided. The importance of drinking ample fluids to avoid dehydration as a result of the prep emphasized.  HPI, Physical Exam, Assessment and Plan have been scribed under the direction and in the presence of Donnalee Curry, MD.  Ples Specter, CMA  I have completed the exam and reviewed the above documentation for accuracy and completeness.  I agree with the above.  Museum/gallery conservator has been used and any errors in dictation or transcription are unintentional.  Donnalee Curry, M.D., F.A.C.S.  Earline Mayotte 09/09/2016, 7:56 PM  Patient has been asked to contact the office once the October schedule is available. Miralax prescription will be sent in to the pharmacy once date arranged. Colonoscopy instructions have been reviewed and provided to the patient today. This patient is aware to call the office if they have further questions.   Nicholes Mango, CMA

## 2016-09-08 NOTE — Patient Instructions (Signed)
Colonoscopy, Adult A colonoscopy is an exam to look at the entire large intestine. During the exam, a lubricated, bendable tube is inserted into the anus and then passed into the rectum, colon, and other parts of the large intestine. A colonoscopy is often done as a part of normal colorectal screening or in response to certain symptoms, such as anemia, persistent diarrhea, abdominal pain, and blood in the stool. The exam can help screen for and diagnose medical problems, including:  Tumors.  Polyps.  Inflammation.  Areas of bleeding.  Tell a health care provider about:  Any allergies you have.  All medicines you are taking, including vitamins, herbs, eye drops, creams, and over-the-counter medicines.  Any problems you or family members have had with anesthetic medicines.  Any blood disorders you have.  Any surgeries you have had.  Any medical conditions you have.  Any problems you have had passing stool. What are the risks? Generally, this is a safe procedure. However, problems may occur, including:  Bleeding.  A tear in the intestine.  A reaction to medicines given during the exam.  Infection (rare).  What happens before the procedure? Eating and drinking restrictions Follow instructions from your health care provider about eating and drinking, which may include:  A few days before the procedure - follow a low-fiber diet. Avoid nuts, seeds, dried fruit, raw fruits, and vegetables.  1-3 days before the procedure - follow a clear liquid diet. Drink only clear liquids, such as clear broth or bouillon, black coffee or tea, clear juice, clear soft drinks or sports drinks, gelatin dessert, and popsicles. Avoid any liquids that contain red or purple dye.  On the day of the procedure - do not eat or drink anything during the 2 hours before the procedure, or within the time period that your health care provider recommends.  Bowel prep If you were prescribed an oral bowel prep  to clean out your colon:  Take it as told by your health care provider. Starting the day before your procedure, you will need to drink a large amount of medicated liquid. The liquid will cause you to have multiple loose stools until your stool is almost clear or light green.  If your skin or anus gets irritated from diarrhea, you may use these to relieve the irritation: ? Medicated wipes, such as adult wet wipes with aloe and vitamin E. ? A skin soothing-product like petroleum jelly.  If you vomit while drinking the bowel prep, take a break for up to 60 minutes and then begin the bowel prep again. If vomiting continues and you cannot take the bowel prep without vomiting, call your health care provider.  General instructions  Ask your health care provider about changing or stopping your regular medicines. This is especially important if you are taking diabetes medicines or blood thinners.  Plan to have someone take you home from the hospital or clinic. What happens during the procedure?  An IV tube may be inserted into one of your veins.  You will be given medicine to help you relax (sedative).  To reduce your risk of infection: ? Your health care team will wash or sanitize their hands. ? Your anal area will be washed with soap.  You will be asked to lie on your side with your knees bent.  Your health care provider will lubricate a long, thin, flexible tube. The tube will have a camera and a light on the end.  The tube will be inserted into your   anus.  The tube will be gently eased through your rectum and colon.  Air will be delivered into your colon to keep it open. You may feel some pressure or cramping.  The camera will be used to take images during the procedure.  A small tissue sample may be removed from your body to be examined under a microscope (biopsy). If any potential problems are found, the tissue will be sent to a lab for testing.  If small polyps are found, your  health care provider may remove them and have them checked for cancer cells.  The tube that was inserted into your anus will be slowly removed. The procedure may vary among health care providers and hospitals. What happens after the procedure?  Your blood pressure, heart rate, breathing rate, and blood oxygen level will be monitored until the medicines you were given have worn off.  Do not drive for 24 hours after the exam.  You may have a small amount of blood in your stool.  You may pass gas and have mild abdominal cramping or bloating due to the air that was used to inflate your colon during the exam.  It is up to you to get the results of your procedure. Ask your health care provider, or the department performing the procedure, when your results will be ready. This information is not intended to replace advice given to you by your health care provider. Make sure you discuss any questions you have with your health care provider. Document Released: 01/15/2000 Document Revised: 11/18/2015 Document Reviewed: 03/31/2015 Elsevier Interactive Patient Education  2018 Elsevier Inc.  

## 2016-09-09 DIAGNOSIS — Z1211 Encounter for screening for malignant neoplasm of colon: Secondary | ICD-10-CM | POA: Insufficient documentation

## 2016-09-19 ENCOUNTER — Telehealth: Payer: Self-pay | Admitting: *Deleted

## 2016-09-19 NOTE — Telephone Encounter (Signed)
Message left on patient's cell number that we have the October schedule available and can get her colonoscopy scheduled.   Patient was given a possible colonoscopy date of 11-09-16. She was told to call the office back to get this arranged.   Miralax prescription will need to be sent in once date scheduled.

## 2016-09-22 ENCOUNTER — Encounter: Payer: Self-pay | Admitting: *Deleted

## 2016-09-22 MED ORDER — POLYETHYLENE GLYCOL 3350 17 GM/SCOOP PO POWD: ORAL | 0 refills | Status: DC

## 2016-09-22 NOTE — Telephone Encounter (Signed)
Patient has been scheduled for a colonoscopy on 11-09-16 at [facility]. Miralax prescription has been sent in to the patient's pharmacy today. This patient is aware to call the office if they have further questions.

## 2016-09-22 NOTE — Telephone Encounter (Signed)
Another message left for patient to call the office.   My Chart message also sent today.

## 2016-11-03 ENCOUNTER — Encounter: Payer: Self-pay | Admitting: *Deleted

## 2016-11-07 ENCOUNTER — Telehealth: Payer: Self-pay | Admitting: *Deleted

## 2016-11-07 NOTE — Telephone Encounter (Signed)
Patient responded via My Chart today.

## 2016-11-07 NOTE — Telephone Encounter (Signed)
My Chart message sent to patient last week with no response.   Tried to reach patient by phone today but no answer and her mailbox was full.   Just wanted to follow up with patient since she is scheduled for a colonoscopy on 11-09-16.

## 2016-11-09 ENCOUNTER — Ambulatory Visit
Admission: RE | Admit: 2016-11-09 | Discharge: 2016-11-09 | Disposition: A | Discharge status: Home / Self Care | Payer: Commercial Managed Care - HMO | Source: Ambulatory Visit | Attending: General Surgery | Admitting: General Surgery

## 2016-11-09 ENCOUNTER — Ambulatory Visit: Payer: Commercial Managed Care - HMO | Admitting: Anesthesiology

## 2016-11-09 ENCOUNTER — Encounter
Admission: RE | Disposition: A | Discharge status: Home / Self Care | Payer: Self-pay | Source: Ambulatory Visit | Attending: General Surgery

## 2016-11-09 DIAGNOSIS — Z1211 Encounter for screening for malignant neoplasm of colon: Secondary | ICD-10-CM | POA: Insufficient documentation

## 2016-11-09 DIAGNOSIS — Q438 Other specified congenital malformations of intestine: Secondary | ICD-10-CM | POA: Insufficient documentation

## 2016-11-09 DIAGNOSIS — Z885 Allergy status to narcotic agent status: Secondary | ICD-10-CM | POA: Insufficient documentation

## 2016-11-09 DIAGNOSIS — Z91041 Radiographic dye allergy status: Secondary | ICD-10-CM | POA: Diagnosis not present

## 2016-11-09 DIAGNOSIS — Z888 Allergy status to other drugs, medicaments and biological substances status: Secondary | ICD-10-CM | POA: Diagnosis not present

## 2016-11-09 DIAGNOSIS — I10 Essential (primary) hypertension: Secondary | ICD-10-CM | POA: Diagnosis not present

## 2016-11-09 DIAGNOSIS — Z87442 Personal history of urinary calculi: Secondary | ICD-10-CM | POA: Diagnosis not present

## 2016-11-09 DIAGNOSIS — R011 Cardiac murmur, unspecified: Secondary | ICD-10-CM | POA: Insufficient documentation

## 2016-11-09 HISTORY — PX: COLONOSCOPY WITH PROPOFOL: SHX5780

## 2016-11-09 LAB — POCT PREGNANCY, URINE: Preg Test, Ur: NEGATIVE

## 2016-11-09 SURGERY — COLONOSCOPY WITH PROPOFOL
Anesthesia: General

## 2016-11-09 MED ORDER — ONDANSETRON HCL 4 MG/2ML IJ SOLN
INTRAMUSCULAR | Status: DC | PRN
  Administered 2016-11-09: 4 mg via INTRAVENOUS

## 2016-11-09 MED ORDER — ONDANSETRON HCL 4 MG/2ML IJ SOLN
INTRAMUSCULAR | Status: AC
  Filled 2016-11-09: qty 2

## 2016-11-09 MED ORDER — PROPOFOL 500 MG/50ML IV EMUL
INTRAVENOUS | Status: DC | PRN
  Administered 2016-11-09: 100 ug/kg/min via INTRAVENOUS

## 2016-11-09 MED ORDER — PROPOFOL 500 MG/50ML IV EMUL
INTRAVENOUS | Status: AC
  Filled 2016-11-09: qty 50

## 2016-11-09 MED ORDER — FENTANYL CITRATE (PF) 100 MCG/2ML IJ SOLN
INTRAMUSCULAR | Status: AC
  Filled 2016-11-09: qty 2

## 2016-11-09 MED ORDER — SODIUM CHLORIDE 0.9 % IV SOLN
INTRAVENOUS | Status: DC
  Administered 2016-11-09: 1000 mL via INTRAVENOUS

## 2016-11-09 MED ORDER — PROPOFOL 10 MG/ML IV BOLUS
INTRAVENOUS | Status: DC | PRN
  Administered 2016-11-09: 90 mg via INTRAVENOUS

## 2016-11-09 MED ORDER — FENTANYL CITRATE (PF) 100 MCG/2ML IJ SOLN
INTRAMUSCULAR | Status: DC | PRN
  Administered 2016-11-09: 25 ug via INTRAVENOUS

## 2016-11-09 NOTE — Anesthesia Preprocedure Evaluation (Signed)
Anesthesia Evaluation  Patient identified by MRN, date of birth, ID band Patient awake    Reviewed: Allergy & Precautions, NPO status , Patient's Chart, lab work & pertinent test results  History of Anesthesia Complications (+) PONV, POST - OP SPINAL HEADACHE and history of anesthetic complications  Airway Mallampati: II       Dental   Pulmonary neg sleep apnea, neg COPD,           Cardiovascular hypertension, (-) Past MI and (-) CHF (-) dysrhythmias + Valvular Problems/Murmurs (murmur, no tx)      Neuro/Psych neg Seizures    GI/Hepatic Neg liver ROS, neg GERD  ,  Endo/Other  neg diabetes  Renal/GU negative Renal ROS     Musculoskeletal   Abdominal   Peds  Hematology   Anesthesia Other Findings   Reproductive/Obstetrics                            Anesthesia Physical Anesthesia Plan  ASA: II  Anesthesia Plan: General   Post-op Pain Management:    Induction: Intravenous  PONV Risk Score and Plan: Propofol infusion  Airway Management Planned: Nasal Cannula  Additional Equipment:   Intra-op Plan:   Post-operative Plan:   Informed Consent: I have reviewed the patients History and Physical, chart, labs and discussed the procedure including the risks, benefits and alternatives for the proposed anesthesia with the patient or authorized representative who has indicated his/her understanding and acceptance.     Plan Discussed with:   Anesthesia Plan Comments:         Anesthesia Quick Evaluation

## 2016-11-09 NOTE — H&P (Signed)
Emily Hurst [id number] 1965/04/26     HPI: Healthy 51 year old woman for screening colonoscopy. She reports tolerating the prep well.  Prescriptions Prior to Admission  Medication Sig Dispense Refill Last Dose  . aspirin-acetaminophen-caffeine (EXCEDRIN MIGRAINE) 250-250-65 MG per tablet Take 1 tablet by mouth every 8 (eight) hours as needed (for migraine/headaches.). AS NEEDED   11/08/2016 at Unknown time  . ibuprofen (ADVIL,MOTRIN) 200 MG tablet Take 200 mg by mouth every 8 (eight) hours as needed (for pain).    11/08/2016 at Unknown time  . polyethylene glycol powder (GLYCOLAX/MIRALAX) powder 255 grams one bottle for colonoscopy prep 255 g 0 11/08/2016 at Unknown time   Allergies  Allergen Reactions  . Biaxin [Clarithromycin] Other (See Comments)  . Contrast Media [Iodinated Diagnostic Agents]     Pt is able to tolerate betadine and eat shrimp without a problem.  Radford Pax [Oxycodone-Acetaminophen]    Past Medical History:  Diagnosis Date  . Chicken pox    34  . Family history of adverse reaction to anesthesia    pt is adopted, does not know family medical history.  Marland Kitchen Heart murmur    diagnosed at age 71.  Marland Kitchen History of kidney stones 2006  . Hypertension    pt is not being treated for hypertension, it is being watched.  . Migraine   . Spinal headache    Past Surgical History:  Procedure Laterality Date  . CESAREAN SECTION    . ENDOMETRIAL ABLATION    . LAPAROSCOPIC APPENDECTOMY N/A 04/22/2016   Procedure: APPENDECTOMY LAPAROSCOPIC;  Surgeon: Earline Mayotte, MD;  Location: [facility] ORS;  Service: General;  Laterality: N/A;  . TUBAL LIGATION     Social History   Social History  . Marital status: Single    Spouse name: N/A  . Number of children: 3  . Years of education: N/A   Occupational History  . Not on file.   Social History Main Topics  . Smoking status: Never Smoker  . Smokeless tobacco: Never Used  . Alcohol use No  . Drug use: No  . Sexual activity: No    Other Topics Concern  . Not on file   Social History Narrative  . No narrative on file   Social History   Social History Narrative  . No narrative on file     ROS: Negative.     PE: HEENT: Negative. Lungs: Clear. Cardio: RR.  Assessment/Plan:  Proceed with planned endoscopy. Earline Mayotte 11/09/2016

## 2016-11-09 NOTE — Anesthesia Post-op Follow-up Note (Signed)
Anesthesia QCDR form completed.        

## 2016-11-09 NOTE — Transfer of Care (Signed)
Immediate Anesthesia Transfer of Care Note  [name]  Procedure(s) Performed: COLONOSCOPY WITH PROPOFOL (N/A )  Patient Location: PACU and Endoscopy Unit  Anesthesia Type:General  Level of Consciousness: sedated and responds to stimulation  Airway & Oxygen Therapy: Patient Spontanous Breathing and Patient connected to nasal cannula oxygen  Post-op Assessment: Report given to RN and Post -op Vital signs reviewed and stable  Post vital signs: Reviewed and stable  Last Vitals:  Vitals:   11/09/16 1022 11/09/16 1125  BP: (!) 138/101 111/78  Pulse: 98 71  Resp: 16 16  Temp: 36.6 C   SpO2: 100% 99%    Last Pain:  Vitals:   11/09/16 1022  TempSrc: Tympanic         Complications: No apparent anesthesia complications

## 2016-11-09 NOTE — Op Note (Signed)
[facility] Patient Name: Emily Hurst Procedure Date: 11/09/2016 10:39 AM MRN: [id number] Account #: [id number] Date of Birth: November 06, 1965 Admit Type: Outpatient Age: 51 Room: [facility] ENDO ROOM 1 Gender: Female Note Status: Finalized Procedure:            Colonoscopy Indications:          Screening for colorectal malignant neoplasm Providers:            Earline Mayotte, MD Referring MD:         Margaretann Loveless (Referring MD) Medicines:            Monitored Anesthesia Care Complications:        No immediate complications. Procedure:            Pre-Anesthesia Assessment:                       - Prior to the procedure, a History and Physical was                        performed, and patient medications, allergies and                        sensitivities were reviewed. The patient's tolerance of                        previous anesthesia was reviewed.                       - The risks and benefits of the procedure and the                        sedation options and risks were discussed with the                        patient. All questions were answered and informed                        consent was obtained.                       After obtaining informed consent, the colonoscope was                        passed under direct vision. Throughout the procedure,                        the patient's blood pressure, pulse, and oxygen                        saturations were monitored continuously. The                        Colonoscope was introduced through the anus and                        advanced to the the terminal ileum. The colonoscopy was                        unusually difficult due to significant looping and a  tortuous colon. Successful completion of the procedure                        was aided by using manual pressure. The patient                        tolerated the procedure well. The quality of the bowel                      preparation was excellent. Findings:      The entire examined colon appeared normal on direct and retroflexion       views. Impression:           - The entire examined colon is normal on direct and                        retroflexion views.                       - No specimens collected. Recommendation:       - Repeat colonoscopy in 10 years for screening purposes. Procedure Code(s):    --- Professional ---                       [phone], Colonoscopy, flexible; diagnostic, including                        collection of specimen(s) by brushing or washing, when                        performed (separate procedure) Diagnosis Code(s):    --- Professional ---                       Z12.11, Encounter for screening for malignant neoplasm                        of colon CPT copyright 2016 American Medical Association. All rights reserved. The codes documented in this report are preliminary and upon coder review may  be revised to meet current compliance requirements. Earline Mayotte, MD 11/09/2016 11:21:47 AM This report has been signed electronically. Number of Addenda: 0 Note Initiated On: 11/09/2016 10:39 AM Scope Withdrawal Time: 0 hours 9 minutes 42 seconds  Total Procedure Duration: 0 hours 36 minutes 32 seconds       [facility]

## 2016-11-09 NOTE — Anesthesia Postprocedure Evaluation (Signed)
Anesthesia Post Note  [name]  Procedure(s) Performed: COLONOSCOPY WITH PROPOFOL (N/A )  Patient location during evaluation: Endoscopy Anesthesia Type: General Level of consciousness: awake and alert Pain management: pain level controlled Vital Signs Assessment: post-procedure vital signs reviewed and stable Respiratory status: spontaneous breathing and respiratory function stable Cardiovascular status: stable Anesthetic complications: no     Last Vitals:  Vitals:   11/09/16 1137 11/09/16 1140  BP: (!) 110/92 (!) 145/92  Pulse: 66   Resp: (!) 34   Temp:    SpO2: 100%     Last Pain:  Vitals:   11/09/16 1125  TempSrc: Tympanic                 Christle Nolting K

## 2016-11-10 ENCOUNTER — Encounter: Payer: Self-pay | Admitting: General Surgery

## 2017-06-21 ENCOUNTER — Encounter: Payer: BLUE CROSS/BLUE SHIELD | Admitting: Physician Assistant

## 2017-06-22 ENCOUNTER — Encounter: Payer: Self-pay | Admitting: Physician Assistant

## 2017-06-22 ENCOUNTER — Ambulatory Visit (INDEPENDENT_AMBULATORY_CARE_PROVIDER_SITE_OTHER): Payer: 59 | Admitting: Physician Assistant

## 2017-06-22 VITALS — BP 150/90 | HR 72 | Temp 97.7°F | Resp 16 | Ht 67.0 in | Wt 122.6 lb

## 2017-06-22 DIAGNOSIS — R030 Elevated blood-pressure reading, without diagnosis of hypertension: Secondary | ICD-10-CM

## 2017-06-22 DIAGNOSIS — F4321 Adjustment disorder with depressed mood: Secondary | ICD-10-CM

## 2017-06-22 DIAGNOSIS — Z1231 Encounter for screening mammogram for malignant neoplasm of breast: Secondary | ICD-10-CM

## 2017-06-22 DIAGNOSIS — E559 Vitamin D deficiency, unspecified: Secondary | ICD-10-CM | POA: Diagnosis not present

## 2017-06-22 DIAGNOSIS — Z1239 Encounter for other screening for malignant neoplasm of breast: Secondary | ICD-10-CM

## 2017-06-22 DIAGNOSIS — Z0000 Encounter for general adult medical examination without abnormal findings: Secondary | ICD-10-CM | POA: Diagnosis not present

## 2017-06-22 NOTE — Progress Notes (Signed)
[name], Female    DOB: 19-Dec-1965, 52 y.o.   MRN: [id number] Visit Date: 06/22/2017  Today's Provider: Margaretann Loveless, PA-C   Chief Complaint  Patient presents with  . Annual Exam   Subjective:    Annual physical exam Emily Hurst is a 52 y.o. female who presents today for health maintenance and complete physical. She feels well. She reports exercising none. She reports she is sleeping well. 06/13/16 CPE 06/13/16 Pap-negative HPV-negative 06/13/16 Mammogram-BI-RADS 1 11/09/16 Colonoscopy-normal -----------------------------------------------------------------   Review of Systems  Constitutional: Positive for fatigue.  HENT: Negative.   Eyes: Negative.   Respiratory: Negative.   Cardiovascular: Negative.   Gastrointestinal: Negative.   Endocrine: Negative.   Genitourinary: Negative.   Musculoskeletal: Negative.   Skin: Negative.   Allergic/Immunologic: Negative.   Neurological: Negative.   Hematological: Negative.   Psychiatric/Behavioral: Negative.     Social History She  reports that she has never smoked. She has never used smokeless tobacco. She reports that she does not drink alcohol or use drugs. Social History   Socioeconomic History  . Marital status: Single    Spouse name: Not on file  . Number of children: 3  . Years of education: Not on file  . Highest education level: Not on file  Occupational History  . Not on file  Social Needs  . Financial resource strain: Not on file  . Food insecurity:    Worry: Not on file    Inability: Not on file  . Transportation needs:    Medical: Not on file    Non-medical: Not on file  Tobacco Use  . Smoking status: Never Smoker  . Smokeless tobacco: Never Used  Substance and Sexual Activity  . Alcohol use: No  . Drug use: No  . Sexual activity: Never    Birth control/protection: None  Lifestyle  . Physical activity:    Days per week: Not on file    Minutes per session: Not on  file  . Stress: Not on file  Relationships  . Social connections:    Talks on phone: Not on file    Gets together: Not on file    Attends religious service: Not on file    Active member of club or organization: Not on file    Attends meetings of clubs or organizations: Not on file    Relationship status: Not on file  Other Topics Concern  . Not on file  Social History Narrative  . Not on file    Patient Active Problem List   Diagnosis Date Noted  . Encounter for screening colonoscopy 09/09/2016  . Encounter for screening mammogram for breast cancer 10/28/2014  . Body water dehydration 09/29/2014  . Early menopause 09/29/2014  . Vitamin D deficiency 09/25/2014    Past Surgical History:  Procedure Laterality Date  . CESAREAN SECTION    . COLONOSCOPY WITH PROPOFOL N/A 11/09/2016   Procedure: COLONOSCOPY WITH PROPOFOL;  Surgeon: Earline Mayotte, MD;  Location: [facility] ENDOSCOPY;  Service: Endoscopy;  Laterality: N/A;  . ENDOMETRIAL ABLATION    . LAPAROSCOPIC APPENDECTOMY N/A 04/22/2016   Procedure: APPENDECTOMY LAPAROSCOPIC;  Surgeon: Earline Mayotte, MD;  Location: [facility] ORS;  Service: General;  Laterality: N/A;  . TUBAL LIGATION      Family History  Family Status  Relation Name Status  . Son  Alive  . Son  Alive  . Son  Alive       develomental delay  Her family history includes ADD / ADHD in her son; Deep vein thrombosis (age of onset: 46) in her son; Healthy in her son. She was adopted.     Allergies  Allergen Reactions  . Biaxin [Clarithromycin] Other (See Comments)  . Contrast Media [Iodinated Diagnostic Agents]     Pt is able to tolerate betadine and eat shrimp without a problem.  Marland Kitchen Percocet [Oxycodone-Acetaminophen]     Previous Medications   ASPIRIN-ACETAMINOPHEN-CAFFEINE (EXCEDRIN MIGRAINE) 250-250-65 MG PER TABLET    Take 1 tablet by mouth every 8 (eight) hours as needed (for migraine/headaches.). AS NEEDED   IBUPROFEN (ADVIL,MOTRIN) 200 MG TABLET     Take 200 mg by mouth every 8 (eight) hours as needed (for pain).     Patient Care Team: Margaretann Loveless, PA-C as PCP - General (Family Medicine) Malva Limes, MD as Referring Physician (Family Medicine) Lemar Livings Merrily Pew, MD (General Surgery)      Objective:   Vitals: BP (!) 150/90 (BP Location: Left Arm, Patient Position: Sitting, Cuff Size: Normal)   Pulse 72   Temp 97.7 F (36.5 C) (Oral)   Resp 16   Ht  (1.702 m)   Wt 122 lb 9.6 oz (55.6 kg)   SpO2 99%   BMI 19.20 kg/m    Physical Exam  Constitutional: She is oriented to person, place, and time. She appears well-developed and well-nourished. No distress.  HENT:  Head: Normocephalic and atraumatic.  Right Ear: Hearing, tympanic membrane, external ear and ear canal normal.  Left Ear: Hearing, tympanic membrane, external ear and ear canal normal.  Nose: Nose normal.  Mouth/Throat: Uvula is midline, oropharynx is clear and moist and mucous membranes are normal. No oropharyngeal exudate.  Eyes: Pupils are equal, round, and reactive to light. Conjunctivae and EOM are normal. Right eye exhibits no discharge. Left eye exhibits no discharge. No scleral icterus.  Neck: Normal range of motion. Neck supple. No JVD present. Carotid bruit is not present. No tracheal deviation present. No thyromegaly present.  Cardiovascular: Normal rate, regular rhythm, normal heart sounds and intact distal pulses. Exam reveals no gallop and no friction rub.  No murmur heard. Pulmonary/Chest: Effort normal and breath sounds normal. No respiratory distress. She has no wheezes. She has no rales. She exhibits no tenderness. Right breast exhibits no inverted nipple, no mass, no nipple discharge, no skin change and no tenderness. Left breast exhibits no inverted nipple, no mass, no nipple discharge, no skin change and no tenderness. No breast swelling, tenderness, discharge or bleeding. Breasts are symmetrical.  Abdominal: Soft. Bowel sounds are  normal. She exhibits no distension and no mass. There is no tenderness. There is no rebound and no guarding.  Musculoskeletal: Normal range of motion. She exhibits no edema or tenderness.  Lymphadenopathy:    She has no cervical adenopathy.  Neurological: She is alert and oriented to person, place, and time.  Skin: Skin is warm and dry. No rash noted. She is not diaphoretic.  Psychiatric: She has a normal mood and affect. Her behavior is normal. Judgment and thought content normal.  Vitals reviewed.    Depression Screen PHQ 2/9 Scores 06/22/2017 06/13/2016 06/10/2015  PHQ - 2 Score 2 0 1  PHQ- 9 Score 5 0 -      Assessment & Plan:     Routine Health Maintenance and Physical Exam  Exercise Activities and Dietary recommendations Goals    . Exercise 150 minutes per week (moderate activity)  Immunization History  Administered Date(s) Administered  . Td 06/29/2006, 06/13/2016  . Tdap 06/29/2006    Health Maintenance  Topic Date Due  . INFLUENZA VACCINE  08/31/2017  . MAMMOGRAM  06/14/2018  . PAP SMEAR  06/14/2019  . TETANUS/TDAP  06/14/2026  . COLONOSCOPY  11/10/2026  . HIV Screening  Completed     Discussed health benefits of physical activity, and encouraged her to engage in regular exercise appropriate for her age and condition.    1. Annual physical exam Normal physical exam today. Will check labs as below and f/u pending lab results. If labs are stable and WNL she will not need to have these rechecked for one year at her next annual physical exam. She is to call the office in the meantime if she has any acute issue, questions or concerns. - CBC with Differential/Platelet - Lipid Panel With LDL/HDL Ratio - Comprehensive metabolic panel - TSH  2. Vitamin D deficiency H/O this. Will check labs as below and f/u pending results. - CBC with Differential/Platelet - VITAMIN D 25 Hydroxy (Vit-D Deficiency, Fractures)  3. Elevated blood-pressure reading without  diagnosis of hypertension Checks occasionally at home and has normal readings.  - Lipid Panel With LDL/HDL Ratio - Comprehensive metabolic panel - TSH  4. Situational depression Stated it started in March 2019 when her son became ill in [place] on a cruise with seizures. He was hospitalized in [place] and they had to stay there until he was well enough to travel. He has since improved and recently graduated from [employer] with a dual master's degree on Mother's Day. She states having continued symptoms due to stress and having "a lot to do" but that symptoms are gradually improving. She reports being about 60% better currently now that things are slowing down.  - TSH  5. Breast cancer screening Breast exam today was normal. There is no family history of breast cancer. She does perform regular self breast exams. Mammogram was ordered as below. Information for [facility] was given to patient so she may schedule her mammogram at her convenience. - MM DIGITAL SCREENING BILATERAL; Future  --------------------------------------------------------------------

## 2017-06-22 NOTE — Patient Instructions (Signed)
Health Maintenance for Postmenopausal Women Menopause is a normal process in which your reproductive ability comes to an end. This process happens gradually over a span of months to years, usually between the ages of 22 and 9. Menopause is complete when you have missed 12 consecutive menstrual periods. It is important to talk with your health care provider about some of the most common conditions that affect postmenopausal women, such as heart disease, cancer, and bone loss (osteoporosis). Adopting a healthy lifestyle and getting preventive care can help to promote your health and wellness. Those actions can also lower your chances of developing some of these common conditions. What should I know about menopause? During menopause, you may experience a number of symptoms, such as:  Moderate-to-severe hot flashes.  Night sweats.  Decrease in sex drive.  Mood swings.  Headaches.  Tiredness.  Irritability.  Memory problems.  Insomnia.  Choosing to treat or not to treat menopausal changes is an individual decision that you make with your health care provider. What should I know about hormone replacement therapy and supplements? Hormone therapy products are effective for treating symptoms that are associated with menopause, such as hot flashes and night sweats. Hormone replacement carries certain risks, especially as you become older. If you are thinking about using estrogen or estrogen with progestin treatments, discuss the benefits and risks with your health care provider. What should I know about heart disease and stroke? Heart disease, heart attack, and stroke become more likely as you age. This may be due, in part, to the hormonal changes that your body experiences during menopause. These can affect how your body processes dietary fats, triglycerides, and cholesterol. Heart attack and stroke are both medical emergencies. There are many things that you can do to help prevent heart disease  and stroke:  Have your blood pressure checked at least every 1-2 years. High blood pressure causes heart disease and increases the risk of stroke.  If you are 53-22 years old, ask your health care provider if you should take aspirin to prevent a heart attack or a stroke.  Do not use any tobacco products, including cigarettes, chewing tobacco, or electronic cigarettes. If you need help quitting, ask your health care provider.  It is important to eat a healthy diet and maintain a healthy weight. ? Be sure to include plenty of vegetables, fruits, low-fat dairy products, and lean protein. ? Avoid eating foods that are high in solid fats, added sugars, or salt (sodium).  Get regular exercise. This is one of the most important things that you can do for your health. ? Try to exercise for at least 150 minutes each week. The type of exercise that you do should increase your heart rate and make you sweat. This is known as moderate-intensity exercise. ? Try to do strengthening exercises at least twice each week. Do these in addition to the moderate-intensity exercise.  Know your numbers.Ask your health care provider to check your cholesterol and your blood glucose. Continue to have your blood tested as directed by your health care provider.  What should I know about cancer screening? There are several types of cancer. Take the following steps to reduce your risk and to catch any cancer development as early as possible. Breast Cancer  Practice breast self-awareness. ? This means understanding how your breasts normally appear and feel. ? It also means doing regular breast self-exams. Let your health care provider know about any changes, no matter how small.  If you are 40  or older, have a clinician do a breast exam (clinical breast exam or CBE) every year. Depending on your age, family history, and medical history, it may be recommended that you also have a yearly breast X-ray (mammogram).  If you  have a family history of breast cancer, talk with your health care provider about genetic screening.  If you are at high risk for breast cancer, talk with your health care provider about having an MRI and a mammogram every year.  Breast cancer (BRCA) gene test is recommended for women who have family members with BRCA-related cancers. Results of the assessment will determine the need for genetic counseling and BRCA1 and for BRCA2 testing. BRCA-related cancers include these types: ? Breast. This occurs in males or females. ? Ovarian. ? Tubal. This may also be called fallopian tube cancer. ? Cancer of the abdominal or pelvic lining (peritoneal cancer). ? Prostate. ? Pancreatic.  Cervical, Uterine, and Ovarian Cancer Your health care provider may recommend that you be screened regularly for cancer of the pelvic organs. These include your ovaries, uterus, and vagina. This screening involves a pelvic exam, which includes checking for microscopic changes to the surface of your cervix (Pap test).  For women ages 21-65, health care providers may recommend a pelvic exam and a Pap test every three years. For women ages 79-65, they may recommend the Pap test and pelvic exam, combined with testing for human papilloma virus (HPV), every five years. Some types of HPV increase your risk of cervical cancer. Testing for HPV may also be done on women of any age who have unclear Pap test results.  Other health care providers may not recommend any screening for nonpregnant women who are considered low risk for pelvic cancer and have no symptoms. Ask your health care provider if a screening pelvic exam is right for you.  If you have had past treatment for cervical cancer or a condition that could lead to cancer, you need Pap tests and screening for cancer for at least 20 years after your treatment. If Pap tests have been discontinued for you, your risk factors (such as having a new sexual partner) need to be  reassessed to determine if you should start having screenings again. Some women have medical problems that increase the chance of getting cervical cancer. In these cases, your health care provider may recommend that you have screening and Pap tests more often.  If you have a family history of uterine cancer or ovarian cancer, talk with your health care provider about genetic screening.  If you have vaginal bleeding after reaching menopause, tell your health care provider.  There are currently no reliable tests available to screen for ovarian cancer.  Lung Cancer Lung cancer screening is recommended for adults 69-62 years old who are at high risk for lung cancer because of a history of smoking. A yearly low-dose CT scan of the lungs is recommended if you:  Currently smoke.  Have a history of at least 30 pack-years of smoking and you currently smoke or have quit within the past 15 years. A pack-year is smoking an average of one pack of cigarettes per day for one year.  Yearly screening should:  Continue until it has been 15 years since you quit.  Stop if you develop a health problem that would prevent you from having lung cancer treatment.  Colorectal Cancer  This type of cancer can be detected and can often be prevented.  Routine colorectal cancer screening usually begins at  age 42 and continues through age 45.  If you have risk factors for colon cancer, your health care provider may recommend that you be screened at an earlier age.  If you have a family history of colorectal cancer, talk with your health care provider about genetic screening.  Your health care provider may also recommend using home test kits to check for hidden blood in your stool.  A small camera at the end of a tube can be used to examine your colon directly (sigmoidoscopy or colonoscopy). This is done to check for the earliest forms of colorectal cancer.  Direct examination of the colon should be repeated every  5-10 years until age 71. However, if early forms of precancerous polyps or small growths are found or if you have a family history or genetic risk for colorectal cancer, you may need to be screened more often.  Skin Cancer  Check your skin from head to toe regularly.  Monitor any moles. Be sure to tell your health care provider: ? About any new moles or changes in moles, especially if there is a change in a mole's shape or color. ? If you have a mole that is larger than the size of a pencil eraser.  If any of your family members has a history of skin cancer, especially at a young age, talk with your health care provider about genetic screening.  Always use sunscreen. Apply sunscreen liberally and repeatedly throughout the day.  Whenever you are outside, protect yourself by wearing long sleeves, pants, a wide-brimmed hat, and sunglasses.  What should I know about osteoporosis? Osteoporosis is a condition in which bone destruction happens more quickly than new bone creation. After menopause, you may be at an increased risk for osteoporosis. To help prevent osteoporosis or the bone fractures that can happen because of osteoporosis, the following is recommended:  If you are 46-71 years old, get at least 1,000 mg of calcium and at least 600 mg of vitamin D per day.  If you are older than age 55 but younger than age 65, get at least 1,200 mg of calcium and at least 600 mg of vitamin D per day.  If you are older than age 54, get at least 1,200 mg of calcium and at least 800 mg of vitamin D per day.  Smoking and excessive alcohol intake increase the risk of osteoporosis. Eat foods that are rich in calcium and vitamin D, and do weight-bearing exercises several times each week as directed by your health care provider. What should I know about how menopause affects my mental health? Depression may occur at any age, but it is more common as you become older. Common symptoms of depression  include:  Low or sad mood.  Changes in sleep patterns.  Changes in appetite or eating patterns.  Feeling an overall lack of motivation or enjoyment of activities that you previously enjoyed.  Frequent crying spells.  Talk with your health care provider if you think that you are experiencing depression. What should I know about immunizations? It is important that you get and maintain your immunizations. These include:  Tetanus, diphtheria, and pertussis (Tdap) booster vaccine.  Influenza every year before the flu season begins.  Pneumonia vaccine.  Shingles vaccine.  Your health care provider may also recommend other immunizations. This information is not intended to replace advice given to you by your health care provider. Make sure you discuss any questions you have with your health care provider. Document Released: 03/11/2005  Document Revised: 08/07/2015 Document Reviewed: 10/21/2014 Elsevier Interactive Patient Education  2018 Elsevier Inc.  

## 2017-06-23 ENCOUNTER — Telehealth: Payer: Self-pay

## 2017-06-23 LAB — CBC WITH DIFFERENTIAL/PLATELET
BASOS: 1 %
Basophils Absolute: 0 10*3/uL (ref 0.0–0.2)
EOS (ABSOLUTE): 0.1 10*3/uL (ref 0.0–0.4)
Eos: 1 %
Hematocrit: 36.5 % (ref 34.0–46.6)
Hemoglobin: 12.8 g/dL (ref 11.1–15.9)
IMMATURE GRANULOCYTES: 0 %
Immature Grans (Abs): 0 10*3/uL (ref 0.0–0.1)
LYMPHS ABS: 1 10*3/uL (ref 0.7–3.1)
Lymphs: 30 %
MCH: 31.9 pg (ref 26.6–33.0)
MCHC: 35.1 g/dL (ref 31.5–35.7)
MCV: 91 fL (ref 79–97)
MONOS ABS: 0.3 10*3/uL (ref 0.1–0.9)
Monocytes: 8 %
NEUTROS PCT: 60 %
Neutrophils Absolute: 2.1 10*3/uL (ref 1.4–7.0)
PLATELETS: 234 10*3/uL (ref 150–450)
RBC: 4.01 x10E6/uL (ref 3.77–5.28)
RDW: 13.3 % (ref 12.3–15.4)
WBC: 3.5 10*3/uL (ref 3.4–10.8)

## 2017-06-23 LAB — COMPREHENSIVE METABOLIC PANEL
ALBUMIN: 4.7 g/dL (ref 3.5–5.5)
ALT: 14 IU/L (ref 0–32)
AST: 16 IU/L (ref 0–40)
Albumin/Globulin Ratio: 1.7 (ref 1.2–2.2)
Alkaline Phosphatase: 58 IU/L (ref 39–117)
BILIRUBIN TOTAL: 0.5 mg/dL (ref 0.0–1.2)
BUN / CREAT RATIO: 14 (ref 9–23)
BUN: 13 mg/dL (ref 6–24)
CALCIUM: 9.6 mg/dL (ref 8.7–10.2)
CHLORIDE: 102 mmol/L (ref 96–106)
CO2: 24 mmol/L (ref 20–29)
CREATININE: 0.93 mg/dL (ref 0.57–1.00)
GFR, EST AFRICAN AMERICAN: 82 mL/min/{1.73_m2} (ref >59)
GFR, EST NON AFRICAN AMERICAN: 71 mL/min/{1.73_m2} (ref >59)
GLUCOSE: 85 mg/dL (ref 65–99)
Globulin, Total: 2.7 g/dL (ref 1.5–4.5)
Potassium: 4.1 mmol/L (ref 3.5–5.2)
Sodium: 140 mmol/L (ref 134–144)
Total Protein: 7.4 g/dL (ref 6.0–8.5)

## 2017-06-23 LAB — LIPID PANEL WITH LDL/HDL RATIO
Cholesterol, Total: 241 mg/dL — ABNORMAL HIGH (ref 100–199)
HDL: 93 mg/dL (ref >39)
LDL Calculated: 135 mg/dL — ABNORMAL HIGH (ref 0–99)
LDl/HDL Ratio: 1.5 ratio (ref 0.0–3.2)
Triglycerides: 63 mg/dL (ref 0–149)
VLDL Cholesterol Cal: 13 mg/dL (ref 5–40)

## 2017-06-23 LAB — VITAMIN D 25 HYDROXY (VIT D DEFICIENCY, FRACTURES): Vit D, 25-Hydroxy: 21.3 ng/mL — ABNORMAL LOW (ref 30.0–100.0)

## 2017-06-23 LAB — TSH: TSH: 1.49 u[IU]/mL (ref 0.450–4.500)

## 2017-06-23 NOTE — Telephone Encounter (Signed)
Patient advised as below.  

## 2017-06-23 NOTE — Telephone Encounter (Signed)
-----   Message from Margaretann Loveless, [place] sent at 06/23/2017  2:06 PM EDT ----- Cholesterol is up compared to last year but cardiovascular risk is still low. No need for cholesterol lowering medications at this time. Continue healthy lifestyle modifications. Blood count is normal. Kidney and liver function normal. Sugar normal. Thyroid normal. Vit D is low but improved from two years ago. Recommend a Vit D supplement OTC of 1000-2000 IU daily. All labs ok to recheck in one year.

## 2017-07-12 ENCOUNTER — Ambulatory Visit
Admission: RE | Admit: 2017-07-12 | Discharge: 2017-07-12 | Disposition: A | Discharge status: Home / Self Care | Payer: Commercial Managed Care - HMO | Source: Ambulatory Visit | Attending: Physician Assistant | Admitting: Physician Assistant

## 2017-07-12 DIAGNOSIS — Z1239 Encounter for other screening for malignant neoplasm of breast: Secondary | ICD-10-CM

## 2017-07-12 DIAGNOSIS — Z1231 Encounter for screening mammogram for malignant neoplasm of breast: Secondary | ICD-10-CM | POA: Insufficient documentation

## 2017-07-13 ENCOUNTER — Telehealth: Payer: Self-pay

## 2017-07-13 NOTE — Telephone Encounter (Signed)
-----   Message from Margaretann LovelessJennifer M Burnette, PA-C sent at 07/13/2017  1:31 PM EDT ----- Normal mammogram. Repeat screening in one year.

## 2017-07-13 NOTE — Telephone Encounter (Signed)
Patient advised as directed below.  Thanks,  -Rena Hunke 

## 2017-09-05 ENCOUNTER — Telehealth: Payer: Self-pay | Admitting: Physician Assistant

## 2017-09-05 DIAGNOSIS — S39012A Strain of muscle, fascia and tendon of lower back, initial encounter: Secondary | ICD-10-CM

## 2017-09-05 MED ORDER — BACLOFEN 10 MG PO TABS: 10.0000 mg | ORAL_TABLET | Freq: Three times a day (TID) | ORAL | 0 refills | Status: DC

## 2017-09-05 MED ORDER — METHYLPREDNISOLONE 4 MG PO TBPK: ORAL_TABLET | ORAL | 0 refills | Status: DC

## 2017-09-05 NOTE — Addendum Note (Signed)
Addended by: Margaretann LovelessBURNETTE, JENNIFER M on: 09/05/2017 05:17 PM   Modules accepted: Orders

## 2017-09-05 NOTE — Telephone Encounter (Signed)
I have sent in a steroid taper and a muscle relaxer to see if this will help the acute pain in the meantime while she awaits an appt. She can be scheduled in one week to see if these medications help. Moist heat can be good as well as epsom salt soaks (2 cups epsom salt in warm bath water). She can await treatment and see if it helps, if not then she can schedule as well.

## 2017-09-05 NOTE — Telephone Encounter (Signed)
Pt called with lower back pain.  She ask for an appt but there is nothing today or tomorrow.  She did say this has been going on for months but has gotten worse in the last couple days and had to leave work yesterday.  Please advise.  Pt's CB [phone]  Thanks teri

## 2017-09-05 NOTE — Telephone Encounter (Signed)
We had the add on patient that came in this afternoon that technically took the 2:40.  That time slot was blocked to allow time to see her so I could not use it to add this patient on.  There are no appts for tomorrow.  Do you want to add her on somewhere else on the schedule?    Thanks Barth Kirkseri

## 2017-09-05 NOTE — Telephone Encounter (Signed)
I now have a 240 if patient can make it today

## 2017-09-06 NOTE — Telephone Encounter (Signed)
Patient advised as directed below. Patient was advised that a steroid taper and muscle relaxer was sent yesterday for her.  Patient scheduled appointment to come back and see provider 08/15 at 8:20 am.  Thanks,  -Latresa Gasser

## 2017-09-06 NOTE — Telephone Encounter (Signed)
Pt called back this morning wanting to know if there is a time she can be worked in today for her back pain.  She had called yesterday but we did not have any appts.  CB# [phone]   Thanks Barth Kirkseri

## 2017-09-06 NOTE — Telephone Encounter (Signed)
I still unfortunately do not have anything available. I will watch for cancellations. Does anyone else have anything. See phone note from yesterday because I sent some meds in to see if they may help since I do not have anything.   I apologize but having 3 providers down has us all so overbooked.

## 2017-09-14 ENCOUNTER — Telehealth: Payer: Self-pay

## 2017-09-14 ENCOUNTER — Ambulatory Visit
Admission: RE | Admit: 2017-09-14 | Discharge: 2017-09-14 | Disposition: A | Discharge status: Home / Self Care | Payer: 59 | Source: Ambulatory Visit | Attending: Physician Assistant | Admitting: Physician Assistant

## 2017-09-14 ENCOUNTER — Ambulatory Visit: Payer: 59 | Admitting: Physician Assistant

## 2017-09-14 ENCOUNTER — Encounter: Payer: Self-pay | Admitting: Physician Assistant

## 2017-09-14 VITALS — BP 138/100 | HR 72 | Temp 98.1°F | Resp 16 | Wt 127.0 lb

## 2017-09-14 DIAGNOSIS — G8929 Other chronic pain: Secondary | ICD-10-CM

## 2017-09-14 DIAGNOSIS — M5441 Lumbago with sciatica, right side: Secondary | ICD-10-CM | POA: Diagnosis not present

## 2017-09-14 DIAGNOSIS — M5136 Other intervertebral disc degeneration, lumbar region: Secondary | ICD-10-CM | POA: Diagnosis not present

## 2017-09-14 DIAGNOSIS — N951 Menopausal and female climacteric states: Secondary | ICD-10-CM | POA: Diagnosis not present

## 2017-09-14 DIAGNOSIS — M545 Low back pain: Secondary | ICD-10-CM | POA: Diagnosis not present

## 2017-09-14 NOTE — Telephone Encounter (Signed)
Patient advised as directed below.  Thanks,  -Joseline 

## 2017-09-14 NOTE — Telephone Encounter (Signed)
-----   Message from Margaretann LovelessJennifer M Burnette, [place]-C sent at 09/14/2017  1:10 PM EDT ----- There is degenerative disc disease and degeneration noted on the facet joints (the outer most joint of the vertebrae of the spine) are noted. No other bony abnormality or malalignment. Would recommend PT to see if this helps the pain and muscle tension.

## 2017-09-14 NOTE — Patient Instructions (Signed)

## 2017-09-14 NOTE — Progress Notes (Signed)
Patient: Emily BackerLisa R Hurst Female    DOB: 1965/04/17   52 y.o.   MRN: [id number] Visit Date: 09/14/2017  Today's Provider: Margaretann LovelessJennifer M Anees Vanecek, PA-C   Chief Complaint  Patient presents with  . Back Pain   Subjective:    HPI Patient here today to follow up on back pain. Patient was started on baclofen and Medrol 6 day taper on 09/05/17. Patient reports good tolerance and compliance with medication. Patient reports symptoms have improved some but are still present. Symptoms occasionally radiate down the right lower extremity to the knee.    Allergies  Allergen Reactions  . Biaxin [Clarithromycin] Other (See Comments)  . Contrast Media [Iodinated Diagnostic Agents]     Pt is able to tolerate betadine and eat shrimp without a problem.  Radford Pax. Percocet [Oxycodone-Acetaminophen]      Current Outpatient Medications:  .  aspirin-acetaminophen-caffeine (EXCEDRIN MIGRAINE) 250-250-65 MG per tablet, Take 1 tablet by mouth every 8 (eight) hours as needed (for migraine/headaches.). AS NEEDED, Disp: , Rfl:  .  baclofen (LIORESAL) 10 MG tablet, Take 1 tablet (10 mg total) by mouth 3 (three) times daily. For muscle spasms, Disp: 30 each, Rfl: 0 .  ibuprofen (ADVIL,MOTRIN) 200 MG tablet, Take 200 mg by mouth every 8 (eight) hours as needed (for pain). , Disp: , Rfl:   Review of Systems  Constitutional: Negative.   Respiratory: Negative.   Cardiovascular: Negative.   Musculoskeletal: Positive for back pain and myalgias. Negative for gait problem.  Neurological: Negative for weakness and numbness.    Social History   Tobacco Use  . Smoking status: Never Smoker  . Smokeless tobacco: Never Used  Substance Use Topics  . Alcohol use: No   Objective:   BP (!) 138/100 (BP Location: Left Arm, Patient Position: Sitting, Cuff Size: Normal)   Pulse 72   Temp 98.1 F (36.7 C) (Oral)   Resp 16   Wt 127 lb (57.6 kg)   SpO2 99%   BMI 19.89 kg/m  Vitals:   09/14/17 0843  BP: (!) 138/100    Pulse: 72  Resp: 16  Temp: 98.1 F (36.7 C)  TempSrc: Oral  SpO2: 99%  Weight: 127 lb (57.6 kg)     Physical Exam  Constitutional: She appears well-developed and well-nourished. No distress.  Neck: Normal range of motion. Neck supple.  Cardiovascular: Normal rate, regular rhythm and normal heart sounds. Exam reveals no gallop and no friction rub.  No murmur heard. Pulmonary/Chest: Effort normal and breath sounds normal. No respiratory distress. She has no wheezes. She has no rales.  Musculoskeletal:       Lumbar back: She exhibits tenderness. She exhibits normal range of motion, no bony tenderness, no pain and no spasm.       Back:  Skin: She is not diaphoretic.  Vitals reviewed.       Assessment & Plan:     1. Chronic bilateral low back pain with right-sided sciatica Will get imaging as below since she has not had imaging previously. Some improvement with muscle relaxer and prednisone but not completely resolved. Back exercises printed. Recommend PT vs ortho referral. Moist heat may help and epsom salt soaks Call if no improvement. - DG Lumbar Spine Complete; Future  2. Menopausal symptoms Previously had ablation. Desires to see if some symptoms she may be having may be associated with menopause. Will check labs as below and f/u pending results. - FSH/LH - Estrogens, total  Mar Daring, PA-C  [facility]

## 2017-09-17 LAB — FSH/LH
FSH: 46.1 m[IU]/mL
LH: 70.3 m[IU]/mL

## 2017-09-17 LAB — ESTROGENS, TOTAL: Estrogen: 204 pg/mL

## 2017-09-18 ENCOUNTER — Telehealth: Payer: Self-pay

## 2017-09-18 DIAGNOSIS — M5441 Lumbago with sciatica, right side: Principal | ICD-10-CM

## 2017-09-18 DIAGNOSIS — G8929 Other chronic pain: Secondary | ICD-10-CM

## 2017-09-18 NOTE — Telephone Encounter (Signed)
-----   Message from Margaretann LovelessJennifer M Burnette, PA-C sent at 09/18/2017  8:22 AM EDT ----- Hormones show you are not menopausal yet.

## 2017-09-18 NOTE — Telephone Encounter (Signed)
Patient advised as directed below. Patient wants PT referral.  Thanks,  -[name]

## 2017-09-18 NOTE — Telephone Encounter (Signed)
Referral placed.

## 2017-10-04 ENCOUNTER — Other Ambulatory Visit: Payer: Self-pay

## 2017-10-04 ENCOUNTER — Ambulatory Visit: Payer: Commercial Managed Care - HMO | Attending: Physician Assistant

## 2017-10-04 DIAGNOSIS — M5416 Radiculopathy, lumbar region: Secondary | ICD-10-CM | POA: Diagnosis not present

## 2017-10-04 DIAGNOSIS — M5441 Lumbago with sciatica, right side: Secondary | ICD-10-CM | POA: Diagnosis not present

## 2017-10-04 DIAGNOSIS — M6281 Muscle weakness (generalized): Secondary | ICD-10-CM | POA: Insufficient documentation

## 2017-10-04 DIAGNOSIS — M5442 Lumbago with sciatica, left side: Secondary | ICD-10-CM | POA: Insufficient documentation

## 2017-10-04 NOTE — Patient Instructions (Signed)
Medbridge Access Code: [email or web address]    Supine Transversus Abdominis Bracing - Hands on Stomach  10x3 with 5 second holds
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
## 2017-10-11 ENCOUNTER — Ambulatory Visit: Payer: Commercial Managed Care - HMO

## 2017-10-11 DIAGNOSIS — M5442 Lumbago with sciatica, left side: Secondary | ICD-10-CM

## 2017-10-11 DIAGNOSIS — M5441 Lumbago with sciatica, right side: Secondary | ICD-10-CM

## 2017-10-11 DIAGNOSIS — M6281 Muscle weakness (generalized): Secondary | ICD-10-CM

## 2017-10-11 DIAGNOSIS — M5416 Radiculopathy, lumbar region: Secondary | ICD-10-CM
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
## 2017-10-11 NOTE — Patient Instructions (Addendum)
Seated hip extension isometrics   Sitting on a chair,    Squeeze your rear end muscles together and press your right foot onto the floor.     Hold for 5 seconds    Repeat 10 times   Perform 3 sets daily.      This is a corrective exercise. Once you no longer have symptoms, you can stop.       Gave supine L hip extension isometrics in SKTC position 10x3 with 5 second holds as part of her HEP. Pt demonstrated and verbalized understanding. Handout provided.    Medbridge Access Code: [email or web address]   Bent Knee Fallouts   10x3 each LE

## 2017-10-17 ENCOUNTER — Ambulatory Visit: Payer: Commercial Managed Care - HMO

## 2017-10-17 DIAGNOSIS — M6281 Muscle weakness (generalized): Secondary | ICD-10-CM

## 2017-10-17 DIAGNOSIS — M5441 Lumbago with sciatica, right side: Secondary | ICD-10-CM

## 2017-10-17 DIAGNOSIS — M5442 Lumbago with sciatica, left side: Secondary | ICD-10-CM | POA: Diagnosis not present

## 2017-10-17 DIAGNOSIS — M5416 Radiculopathy, lumbar region: Secondary | ICD-10-CM
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
## 2017-10-17 NOTE — Patient Instructions (Signed)
MedbridgeAccess Code: [email or web address]   Left Standing Lateral Shift Correction at Wall - Repetitions   2x10 with 5 second holds to neutral

## 2017-10-18 ENCOUNTER — Telehealth: Payer: Self-pay | Admitting: Physician Assistant

## 2017-10-18 DIAGNOSIS — M545 Low back pain, unspecified: Secondary | ICD-10-CM

## 2017-10-18 NOTE — Telephone Encounter (Signed)
Pt is still taking Advil for her back pain.  Pt has gone from taking 1 to 2 at a time and to having to take them more often.  Pt is needing something stronger to ease the pain.  Pt is asking if there is something else that can be prescribed and or advise pt to do to control back pain.  Please call pt back to advise.    Thanks, [employer]

## 2017-10-19 MED ORDER — MELOXICAM 15 MG PO TABS: 15.0000 mg | ORAL_TABLET | Freq: Every day | ORAL | 0 refills | Status: DC

## 2017-10-19 NOTE — Telephone Encounter (Signed)
Meloxicam sent in.

## 2017-10-23 ENCOUNTER — Ambulatory Visit: Payer: Commercial Managed Care - HMO

## 2017-10-23 DIAGNOSIS — M5416 Radiculopathy, lumbar region: Secondary | ICD-10-CM

## 2017-10-23 DIAGNOSIS — M6281 Muscle weakness (generalized): Secondary | ICD-10-CM

## 2017-10-23 DIAGNOSIS — M5442 Lumbago with sciatica, left side: Secondary | ICD-10-CM

## 2017-10-23 DIAGNOSIS — M5441 Lumbago with sciatica, right side: Secondary | ICD-10-CM
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
## 2017-10-23 NOTE — Patient Instructions (Signed)
Pt was recommended to hold off on the supine SKTC hip extension HEP for now

## 2017-10-25 ENCOUNTER — Ambulatory Visit: Payer: Commercial Managed Care - HMO

## 2017-10-25 DIAGNOSIS — M6281 Muscle weakness (generalized): Secondary | ICD-10-CM

## 2017-10-25 DIAGNOSIS — M5441 Lumbago with sciatica, right side: Secondary | ICD-10-CM

## 2017-10-25 DIAGNOSIS — M5416 Radiculopathy, lumbar region: Secondary | ICD-10-CM

## 2017-10-25 DIAGNOSIS — M5442 Lumbago with sciatica, left side: Secondary | ICD-10-CM | POA: Diagnosis not present
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
## 2017-10-25 NOTE — Patient Instructions (Signed)
MedbridgeAccess Code: [email or web address]    Standing Anti-Rotation Press with Anchored Resistance  Resistance band (yellow) on R side 10x3 with 5 second holds

## 2017-10-31 ENCOUNTER — Ambulatory Visit: Payer: Commercial Managed Care - HMO

## 2017-11-01 ENCOUNTER — Ambulatory Visit: Payer: Commercial Managed Care - HMO | Attending: Physician Assistant

## 2017-11-01 DIAGNOSIS — M5441 Lumbago with sciatica, right side: Secondary | ICD-10-CM | POA: Insufficient documentation

## 2017-11-01 DIAGNOSIS — M6281 Muscle weakness (generalized): Secondary | ICD-10-CM | POA: Diagnosis present

## 2017-11-01 DIAGNOSIS — M5416 Radiculopathy, lumbar region: Secondary | ICD-10-CM | POA: Diagnosis not present

## 2017-11-01 DIAGNOSIS — M5442 Lumbago with sciatica, left side: Secondary | ICD-10-CM | POA: Diagnosis not present
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
## 2017-11-01 NOTE — Patient Instructions (Signed)
Gave standing bilateral shoulder extension resisting band as part of HEP previously.

## 2017-11-06 ENCOUNTER — Ambulatory Visit: Payer: Commercial Managed Care - HMO

## 2017-11-08 ENCOUNTER — Telehealth: Payer: Self-pay

## 2017-11-08 ENCOUNTER — Ambulatory Visit: Payer: Commercial Managed Care - HMO

## 2017-11-08 NOTE — Telephone Encounter (Signed)
  No show. Called patient home phone number. Unable to leave a message secondary to pt mailbox being full.

## 2017-11-13 ENCOUNTER — Telehealth: Payer: Self-pay

## 2017-11-13 ENCOUNTER — Ambulatory Visit: Payer: Commercial Managed Care - HMO

## 2017-11-13 NOTE — Telephone Encounter (Signed)
No show. Called patient and left a message pertaining to appointment and a reminder for the next follow up session. Return phone call requested. Phone number ([phone]) provided.   

## 2017-11-16 ENCOUNTER — Ambulatory Visit: Payer: Commercial Managed Care - HMO

## 2017-11-20 ENCOUNTER — Ambulatory Visit: Payer: Commercial Managed Care - HMO

## 2017-11-21 ENCOUNTER — Ambulatory Visit: Payer: Commercial Managed Care - HMO

## 2017-11-21 DIAGNOSIS — M5416 Radiculopathy, lumbar region: Secondary | ICD-10-CM

## 2017-11-21 DIAGNOSIS — M5442 Lumbago with sciatica, left side: Secondary | ICD-10-CM

## 2017-11-21 DIAGNOSIS — M5441 Lumbago with sciatica, right side: Secondary | ICD-10-CM

## 2017-11-21 DIAGNOSIS — M6281 Muscle weakness (generalized): Secondary | ICD-10-CM

## 2017-11-21 NOTE — Addendum Note (Signed)
Addended by: Charlene Brooke on: 11/21/2017 07:28 PM   Modules accepted: Orders
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
## 2017-11-23 ENCOUNTER — Ambulatory Visit: Payer: Commercial Managed Care - HMO

## 2017-11-27 ENCOUNTER — Ambulatory Visit: Payer: Commercial Managed Care - HMO

## 2017-12-04 ENCOUNTER — Telehealth: Payer: Self-pay

## 2017-12-04 ENCOUNTER — Ambulatory Visit: Payer: 59 | Admitting: Physician Assistant

## 2017-12-04 ENCOUNTER — Encounter: Payer: Self-pay | Admitting: Physician Assistant

## 2017-12-04 VITALS — BP 120/80 | HR 79 | Temp 98.7°F | Resp 16 | Wt 128.0 lb

## 2017-12-04 DIAGNOSIS — K2900 Acute gastritis without bleeding: Secondary | ICD-10-CM

## 2017-12-04 MED ORDER — SUCRALFATE 1 G PO TABS: 1.0000 g | ORAL_TABLET | Freq: Three times a day (TID) | ORAL | 0 refills | Status: DC

## 2017-12-04 NOTE — Patient Instructions (Signed)
Peptic Ulcer A peptic ulcer is a painful sore in the lining of your esophagus, stomach, or the first part of your small intestine. You may have pain in the area between your chest and your belly button. The most common causes of an ulcer are:  An infection.  Using certain pain medicines too often or too much.  Follow these instructions at home:  Avoid alcohol.  Avoid caffeine.  Do not use any tobacco products. These include cigarettes, chewing tobacco, and e-cigarettes. If you need help quitting, ask your doctor.  Take over-the-counter and prescription medicines only as told by your doctor. Do not stop or change your medicines unless you talk with your doctor about it first.  Keep all follow-up visits as told by your doctor. This is important. Contact a doctor if:  You do not get better in 7 days after you start treatment.  You keep having an upset stomach (indigestion) or heartburn. Get help right away if:  You have sudden, sharp pain in your belly (abdomen).  You have lasting belly pain.  You have bloody poop (stool) or black, tarry poop.  You throw up (vomit) blood. It may look like coffee grounds.  You feel light-headed or feel like you may pass out (faint).  You get weak.  You get sweaty or feel sticky and cold to the touch (clammy). This information is not intended to replace advice given to you by your health care provider. Make sure you discuss any questions you have with your health care provider. Document Released: 04/13/2009 Document Revised: 06/03/2015 Document Reviewed: 10/18/2014 Elsevier Interactive Patient Education  2018 [employer].  Sucralfate tablets What is this medicine? SUCRALFATE (SOO kral fate) helps to treat ulcers of the intestine. This medicine may be used for other purposes; ask your health care provider or pharmacist if you have questions. COMMON BRAND NAME(S): Carafate What should I tell my health care provider before I take this  medicine? They need to know if you have any of these conditions: -kidney disease -an unusual or allergic reaction to sucralfate, other medicines, foods, dyes, or preservatives -pregnant or trying to get pregnant -breast-feeding How should I use this medicine? Take this medicine by mouth with a glass of water. Follow the directions on the prescription label. This medicine works best if you take it on an empty stomach, 1 hour before meals. Take your doses at regular intervals. Do not take your medicine more often than directed. Do not stop taking except on your doctor's advice. Talk to your pediatrician regarding the use of this medicine in children. Special care may be needed. Overdosage: If you think you have taken too much of this medicine contact a poison control center or emergency room at once. NOTE: This medicine is only for you. Do not share this medicine with others. What if I miss a dose? If you miss a dose, take it as soon as you can. If it is almost time for your next dose, take only that dose. Do not take double or extra doses. What may interact with this medicine? -antacid -cimetidine -digoxin -ketoconazole -phenytoin -quinidine -ranitidine -some antibiotics like ciprofloxacin, norfloxacin, and ofloxacin -theophylline -thyroid hormones -warfarin This list may not describe all possible interactions. Give your health care provider a list of all the medicines, herbs, non-prescription drugs, or dietary supplements you use. Also tell them if you smoke, drink alcohol, or use illegal drugs. Some items may interact with your medicine. What should I watch for while using this medicine?  Visit your doctor or health care professional for regular check ups. Let your doctor know if your symptoms do not improve or if you feel worse. Antacids should not be taken within one half hour before or after this medicine. What side effects may I notice from receiving this medicine? Side effects that  you should report to your doctor or health care professional as soon as possible: -allergic reactions like skin rash, itching or hives, swelling of the face, lips, or tongue -difficulty breathing Side effects that usually do not require medical attention (report to your doctor or health care professional if they continue or are bothersome): -back pain -constipation -drowsy, dizzy -dry mouth -headache -stomach upset, gas -trouble sleeping This list may not describe all possible side effects. Call your doctor for medical advice about side effects. You may report side effects to FDA at 1-800-FDA-1088. Where should I keep my medicine? Keep out of the reach of children. Store at room temperature between 15 and 30 degrees C (59 and 86 degrees F). Keep container tightly closed. Throw away any unused medicine after the expiration date. NOTE: This sheet is a summary. It may not cover all possible information. If you have questions about this medicine, talk to your doctor, pharmacist, or health care provider.  2018 Elsevier/Gold Standard (2007-09-19 15:46:20)

## 2017-12-04 NOTE — Progress Notes (Signed)
[name] Female    DOB: 03-12-1965   52 y.o.   MRN: [id number] Visit Date: 12/04/2017  Today's Provider: Margaretann Loveless, PA-C   Chief Complaint  Patient presents with  . Abdominal Pain   Subjective:    HPI  Patient here with c/o dull pain, epigastric region. Patient reports that it started on Thursday night, but worsened on Friday and reports that is when she called and the nurse that she spoke to advised her to go to the ED but she didn't go. Patient reports the pain is better. She tried Tums and drinking ginger Ale. Patient reports it gets aggravated by eating.  Patient Declined Influenza Vaccine.    Allergies  Allergen Reactions  . Biaxin [Clarithromycin] Other (See Comments)  . Contrast Media [Iodinated Diagnostic Agents]     Pt is able to tolerate betadine and eat shrimp without a problem.  Radford Pax [Oxycodone-Acetaminophen]      Current Outpatient Medications:  .  aspirin-acetaminophen-caffeine (EXCEDRIN MIGRAINE) 250-250-65 MG per tablet, Take 1 tablet by mouth every 8 (eight) hours as needed (for migraine/headaches.). AS NEEDED, Disp: , Rfl:  .  baclofen (LIORESAL) 10 MG tablet, Take 1 tablet (10 mg total) by mouth 3 (three) times daily. For muscle spasms (Patient not taking: Reported on 12/04/2017), Disp: 30 each, Rfl: 0 .  meloxicam (MOBIC) 15 MG tablet, Take 1 tablet (15 mg total) by mouth daily. (Patient not taking: Reported on 12/04/2017), Disp: 90 tablet, Rfl: 0  Review of Systems  Constitutional: Positive for appetite change. Negative for fever.  Respiratory: Negative.   Cardiovascular: Negative.   Gastrointestinal: Positive for abdominal distention, abdominal pain and nausea. Negative for blood in stool, constipation, diarrhea and vomiting.  Genitourinary: Negative.   Neurological: Negative.     Social History   Tobacco Use  . Smoking status: Never Smoker  . Smokeless tobacco: Never Used  Substance Use Topics  . Alcohol use:  No   Objective:   BP 120/80 (BP Location: Left Arm, Patient Position: Sitting, Cuff Size: Normal)   Pulse 79   Temp 98.7 F (37.1 C) (Oral)   Resp 16   Wt 128 lb (58.1 kg)   BMI 20.05 kg/m  Vitals:   12/04/17 1708  BP: 120/80  Pulse: 79  Resp: 16  Temp: 98.7 F (37.1 C)  TempSrc: Oral  Weight: 128 lb (58.1 kg)     Physical Exam  Constitutional: She is oriented to person, place, and time. She appears well-developed and well-nourished. No distress.  Cardiovascular: Normal rate, regular rhythm and normal heart sounds. Exam reveals no gallop and no friction rub.  No murmur heard. Pulmonary/Chest: Effort normal and breath sounds normal. No respiratory distress. She has no wheezes. She has no rales.  Abdominal: Soft. Normal appearance and bowel sounds are normal. She exhibits no distension and no mass. There is no hepatosplenomegaly. There is tenderness in the epigastric area, periumbilical area and left upper quadrant. There is no rebound, no guarding and no CVA tenderness.  Neurological: She is alert and oriented to person, place, and time.  Skin: Skin is warm and dry. She is not diaphoretic.        Assessment & Plan:     1. Acute gastritis without hemorrhage, unspecified gastritis type DDx: gastric ulcer, gastritis, H.Pylori infection, gastroenteritis (viral). I will treat with sucralfate as below. She is to call if symptoms are not improving over the next 2 weeks and will proceed with  labs and urea breath test.  - sucralfate (CARAFATE) 1 g tablet; Take 1 tablet (1 g total) by mouth 4 (four) times daily -  with meals and at bedtime.  Dispense: 56 tablet; Refill: 0       Margaretann Loveless, PA-C  [facility]

## 2017-12-04 NOTE — Telephone Encounter (Signed)
Patient reports that her abdominal pain started on Thursday and Friday. She reports that on Friday she was advised to go to the ED but didn't. Reports she still having a dull pain but not as bad like it was on Friday.

## 2018-04-09 ENCOUNTER — Encounter: Payer: Self-pay | Admitting: Physician Assistant

## 2018-04-09 ENCOUNTER — Ambulatory Visit: Payer: 59 | Admitting: Physician Assistant

## 2018-04-09 VITALS — BP 134/90 | HR 94 | Temp 98.3°F | Wt 132.2 lb

## 2018-04-09 DIAGNOSIS — S139XXA Sprain of joints and ligaments of unspecified parts of neck, initial encounter: Secondary | ICD-10-CM | POA: Diagnosis not present

## 2018-04-09 DIAGNOSIS — M62838 Other muscle spasm: Secondary | ICD-10-CM

## 2018-04-09 MED ORDER — CYCLOBENZAPRINE HCL 5 MG PO TABS: 5.0000 mg | ORAL_TABLET | Freq: Three times a day (TID) | ORAL | 1 refills | Status: DC | PRN

## 2018-04-09 MED ORDER — METHYLPREDNISOLONE 4 MG PO TBPK: ORAL_TABLET | ORAL | 0 refills | Status: DC

## 2018-04-09 NOTE — Patient Instructions (Signed)
Cervical Sprain    A cervical sprain is a stretch or tear in one or more of the tough, cord-like tissues that connect bones (ligaments) in the neck. Cervical sprains can range from mild to severe. Severe cervical sprains can cause the spinal bones (vertebrae) in the neck to be unstable. This can lead to spinal cord damage and can result in serious nervous system problems.  The amount of time that it takes for a cervical sprain to get better depends on the cause and extent of the injury. Most cervical sprains heal in 4-6 weeks.  What are the causes?  Cervical sprains may be caused by an injury (trauma), such as from a motor vehicle accident, a fall, or sudden forward and backward whipping movement of the head and neck (whiplash injury).  Mild cervical sprains may be caused by wear and tear over time, such as from poor posture, sitting in a chair that does not provide support, or looking up or down for long periods of time.  What increases the risk?  The following factors may make you more likely to develop this condition:   Participating in activities that have a high risk of trauma to the neck. These include contact sports, auto racing, gymnastics, and diving.   Taking risks when driving or riding in a motor vehicle, such as speeding.   Having osteoarthritis of the spine.   Having poor strength and flexibility of the neck.   A previous neck injury.   Having poor posture.   Spending a lot of time in certain positions that put stress on the neck, such as sitting at a computer for long periods of time.  What are the signs or symptoms?  Symptoms of this condition include:   Pain, soreness, stiffness, tenderness, swelling, or a burning sensation in the front, back, or sides of the neck.   Sudden tightening of neck muscles that you cannot control (muscle spasms).   Pain in the shoulders or upper back.   Limited ability to move the neck.   Headache.   Dizziness.   Nausea.   Vomiting.   Weakness,  numbness, or tingling in a hand or an arm.  Symptoms may develop right away after injury, or they may develop over a few days. In some cases, symptoms may go away with treatment and return (recur) over time.  How is this diagnosed?  This condition may be diagnosed based on:   Your medical history.   Your symptoms.   Any recent injuries or known neck problems that you have, such as arthritis in the neck.   A physical exam.   Imaging tests, such as:  ? X-rays.  ? MRI.  ? CT scan.  How is this treated?  This condition is treated by resting and icing the injured area and doing physical therapy exercises. Depending on the severity of your condition, treatment may also include:   Keeping your neck in place (immobilized) for periods of time. This may be done using:  ? A cervical collar. This supports your chin and the back of your head.  ? A cervical traction device. This is a sling that holds up your head. This removes weight and pressure from your neck, and it may help to relieve pain.   Medicines that help to relieve pain and inflammation.   Medicines that help to relax your muscles (muscle relaxants).   Surgery. This is rare.  Follow these instructions at home:  If you have a cervical collar:       Wear it as told by your health care provider. Do not remove the collar unless instructed by your health care provider.   Ask your health care provider before you make any adjustments to your collar.   If you have long hair, keep it outside of the collar.   Ask your health care provider if you can remove the collar for cleaning and bathing. If you are allowed to remove the collar for cleaning or bathing:  ? Follow instructions from your health care provider about how to remove the collar safely.  ? Clean the collar by wiping it with mild soap and water and drying it completely.  ? If your collar has removable pads, remove them every 1-2 days and wash them by hand with soap and water. Let them air-dry completely  before you put them back in the collar.  ? Check your skin under the collar for irritation or sores. If you see any, tell your health care provider.  Managing pain, stiffness, and swelling     If directed, use a cervical traction device as told by your health care provider.   If directed, apply heat to the affected area before you do your physical therapy or as often as told by your health care provider. Use the heat source that your health care provider recommends, such as a moist heat pack or a heating pad.  ? Place a towel between your skin and the heat source.  ? Leave the heat on for 20-30 minutes.  ? Remove the heat if your skin turns bright red. This is especially important if you are unable to feel pain, heat, or cold. You may have a greater risk of getting burned.   If directed, put ice on the affected area:  ? Put ice in a plastic bag.  ? Place a towel between your skin and the bag.  ? Leave the ice on for 20 minutes, 2-3 times a day.  Activity   Do not drive while wearing a cervical collar. If you do not have a cervical collar, ask your health care provider if it is safe to drive while your neck heals.   Do not drive or use heavy machinery while taking prescription pain medicine or muscle relaxants, unless your health care provider approves.   Do not lift anything that is heavier than 10 lb (4.5 kg) until your health care provider tells you that it is safe.   Rest as directed by your health care provider. Avoid positions and activities that make your symptoms worse. Ask your health care provider what activities are safe for you.   If physical therapy was prescribed, do exercises as told by your health care provider or physical therapist.  General instructions   Take over-the-counter and prescription medicines only as told by your health care provider.   Do not use any products that contain nicotine or tobacco, such as cigarettes and e-cigarettes. These can delay healing. If you need help  quitting, ask your health care provider.   Keep all follow-up visits as told by your health care provider or physical therapist. This is important.  How is this prevented?  To prevent a cervical sprain from happening again:   Use and maintain good posture. Make any needed adjustments to your workstation to help you use good posture.   Exercise regularly as directed by your health care provider or physical therapist.   Avoid risky activities that may cause a cervical sprain.  Contact a health care provider   if:   You have symptoms that get worse or do not get better after 2 weeks of treatment.   You have pain that gets worse or does not get better with medicine.   You develop new, unexplained symptoms.   You have sores or irritated skin on your neck from wearing your cervical collar.  Get help right away if:   You have severe pain.   You develop numbness, tingling, or weakness in any part of your body.   You cannot move a part of your body (you have paralysis).   You have neck pain along with:  ? Severe dizziness.  ? Headache.  Summary   A cervical sprain is a stretch or tear in one or more of the tough, cord-like tissues that connect bones (ligaments) in the neck.   Cervical sprains may be caused by an injury (trauma), such as from a motor vehicle accident, a fall, or sudden forward and backward whipping movement of the head and neck (whiplash injury).   Symptoms may develop right away after injury, or they may develop over a few days.   This condition is treated by resting and icing the injured area and doing physical therapy exercises.  This information is not intended to replace advice given to you by your health care provider. Make sure you discuss any questions you have with your health care provider.  Document Released: 11/14/2006 Document Revised: 09/16/2015 Document Reviewed: 09/16/2015  Elsevier Interactive Patient Education  2019 [employer]      Cervical Strain and Sprain Rehab  Ask your  health care provider which exercises are safe for you. Do exercises exactly as told by your health care provider and adjust them as directed. It is normal to feel mild stretching, pulling, tightness, or discomfort as you do these exercises, but you should stop right away if you feel sudden pain or your pain gets worse.Do not begin these exercises until told by your health care provider.  Stretching and range of motion exercises  These exercises warm up your muscles and joints and improve the movement and flexibility of your neck. These exercises also help to relieve pain, numbness, and tingling.  Exercise A: Cervical side bend    1. Using good posture, sit on a stable chair or stand up.  2. Without moving your shoulders, slowly tilt your left / right ear to your shoulder until you feel a stretch in your neck muscles. You should be looking straight ahead.  3. Hold for __________ seconds.  4. Repeat with the other side of your neck.  Repeat __________ times. Complete this exercise __________ times a day.  Exercise B: Cervical rotation    1. Using good posture, sit on a stable chair or stand up.  2. Slowly turn your head to the side as if you are looking over your left / right shoulder.  ? Keep your eyes level with the ground.  ? Stop when you feel a stretch along the side and the back of your neck.  3. Hold for __________ seconds.  4. Repeat this by turning to your other side.  Repeat __________ times. Complete this exercise __________ times a day.  Exercise C: Thoracic extension and pectoral stretch  1. Roll a towel or a small blanket so it is about 4 inches (10 cm) in diameter.  2. Lie down on your back on a firm surface.  3. Put the towel lengthwise, under your spine in the middle of your back. It should   not be not under your shoulder blades. The towel should line up with your spine from your middle back to your lower back.  4. Put your hands behind your head and let your elbows fall out to your sides.  5. Hold  for __________ seconds.  Repeat __________ times. Complete this exercise __________ times a day.  Strengthening exercises  These exercises build strength and endurance in your neck. Endurance is the ability to use your muscles for a long time, even after your muscles get tired.  Exercise D: Upper cervical flexion, isometric  1. Lie on your back with a thin pillow behind your head and a small rolled-up towel under your neck.  2. Gently tuck your chin toward your chest and nod your head down to look toward your feet. Do not lift your head off the pillow.  3. Hold for __________ seconds.  4. Release the tension slowly. Relax your neck muscles completely before you repeat this exercise.  Repeat __________ times. Complete this exercise __________ times a day.  Exercise E: Cervical extension, isometric    1. Stand about 6 inches (15 cm) away from a wall, with your back facing the wall.  2. Place a soft object, about 6-8 inches (15-20 cm) in diameter, between the back of your head and the wall. A soft object could be a small pillow, a ball, or a folded towel.  3. Gently tilt your head back and press into the soft object. Keep your jaw and forehead relaxed.  4. Hold for __________ seconds.  5. Release the tension slowly. Relax your neck muscles completely before you repeat this exercise.  Repeat __________ times. Complete this exercise __________ times a day.  Posture and body mechanics  Body mechanics refers to the movements and positions of your body while you do your daily activities. Posture is part of body mechanics. Good posture and healthy body mechanics can help to relieve stress in your body's tissues and joints. Good posture means that your spine is in its natural S-curve position (your spine is neutral), your shoulders are pulled back slightly, and your head is not tipped forward. The following are general guidelines for applying improved posture and body mechanics to your everyday activities.  Standing     When  standing, keep your spine neutral and keep your feet about hip-width apart. Keep a slight bend in your knees. Your ears, shoulders, and hips should line up.   When you do a task in which you stand in one place for a long time, place one foot up on a stable object that is 2-4 inches (5-10 cm) high, such as a footstool. This helps keep your spine neutral.  Sitting     When sitting, keep your spine neutral and your keep feet flat on the floor. Use a footrest, if necessary, and keep your thighs parallel to the floor. Avoid rounding your shoulders, and avoid tilting your head forward.   When working at a desk or a computer, keep your desk at a height where your hands are slightly lower than your elbows. Slide your chair under your desk so you are close enough to maintain good posture.   When working at a computer, place your monitor at a height where you are looking straight ahead and you do not have to tilt your head forward or downward to look at the screen.  Resting  When lying down and resting, avoid positions that are most painful for you. Try to support your neck   in a neutral position. You can use a contour pillow or a small rolled-up towel. Your pillow should support your neck but not push on it.  This information is not intended to replace advice given to you by your health care provider. Make sure you discuss any questions you have with your health care provider.  Document Released: 01/17/2005 Document Revised: 09/24/2015 Document Reviewed: 12/24/2014  Elsevier Interactive Patient Education  2019 [employer]

## 2018-04-09 NOTE — Progress Notes (Signed)
[name] Female    DOB: 01-Jun-1965   53 y.o.   MRN: [id number] Visit Date: 04/09/2018  Today's Provider: Margaretann Loveless, PA-C   Chief Complaint  Patient presents with  . Neck Pain   Subjective:     Neck Pain   This is a new problem. The current episode started 1 to 4 weeks ago (2 weeks ago). The problem has been gradually worsening. The pain is associated with nothing. The pain is present in the right side (pain initial was at center of neck). Quality: "uncomfortable" The symptoms are aggravated by bending and position. The pain is same all the time. Stiffness is present all day. Pertinent negatives include no chest pain, fever, headaches, leg pain, numbness, pain with swallowing, paresis, photophobia, syncope, tingling, trouble swallowing, visual change, weakness or weight loss. She has tried NSAIDs (Meloxicam) for the symptoms. The treatment provided no relief.    Allergies  Allergen Reactions  . Biaxin [Clarithromycin] Other (See Comments)  . Contrast Media [Iodinated Diagnostic Agents]     Pt is able to tolerate betadine and eat shrimp without a problem.  Radford Pax [Oxycodone-Acetaminophen]      Current Outpatient Medications:  .  meloxicam (MOBIC) 15 MG tablet, Take 1 tablet (15 mg total) by mouth daily., Disp: 90 tablet, Rfl: 0 .  aspirin-acetaminophen-caffeine (EXCEDRIN MIGRAINE) 250-250-65 MG per tablet, Take 1 tablet by mouth every 8 (eight) hours as needed (for migraine/headaches.). AS NEEDED, Disp: , Rfl:  .  baclofen (LIORESAL) 10 MG tablet, Take 1 tablet (10 mg total) by mouth 3 (three) times daily. For muscle spasms (Patient not taking: Reported on 12/04/2017), Disp: 30 each, Rfl: 0 .  sucralfate (CARAFATE) 1 g tablet, Take 1 tablet (1 g total) by mouth 4 (four) times daily -  with meals and at bedtime. (Patient not taking: Reported on 04/09/2018), Disp: 56 tablet, Rfl: 0  Review of Systems  Constitutional: Negative for fever and weight loss.    HENT: Negative for trouble swallowing.   Eyes: Negative for photophobia.  Respiratory: Negative.   Cardiovascular: Negative for chest pain and syncope.  Musculoskeletal: Positive for neck pain.  Neurological: Negative for tingling, weakness, numbness and headaches.    Social History   Tobacco Use  . Smoking status: Never Smoker  . Smokeless tobacco: Never Used  Substance Use Topics  . Alcohol use: No      Objective:   BP 134/90   Pulse 94   Temp 98.3 F (36.8 C) (Oral)   Wt 132 lb 3.2 oz (60 kg)   HC 16" (40.6 cm)   BMI 20.71 kg/m  Vitals:   04/09/18 1440  BP: 134/90  Pulse: 94  Temp: 98.3 F (36.8 C)  TempSrc: Oral  Weight: 132 lb 3.2 oz (60 kg)  HC: 16" (40.6 cm)     Physical Exam Vitals signs reviewed.  Constitutional:      General: She is not in acute distress.    Appearance: Normal appearance. She is well-developed and normal weight. She is not ill-appearing or diaphoretic.  HENT:     Head: Normocephalic and atraumatic.  Neck:     Musculoskeletal: Neck supple. Decreased range of motion. Pain with movement and muscular tenderness (right sided muscular pain with spasm) present. No erythema or spinous process tenderness.     Thyroid: No thyromegaly.     Vascular: No JVD.     Trachea: No tracheal deviation.  Cardiovascular:  Rate and Rhythm: Normal rate and regular rhythm.     Heart sounds: Normal heart sounds. No murmur. No friction rub. No gallop.   Pulmonary:     Effort: Pulmonary effort is normal. No respiratory distress.     Breath sounds: Normal breath sounds. No wheezing or rales.  Lymphadenopathy:     Cervical: No cervical adenopathy.  Neurological:     Mental Status: She is alert.         Assessment & Plan    1. Acute sprain of ligament of neck, initial encounter Will treat conservatively with flexeril for spasm and medrol dose pak for inflammation. If no improvements she is to call the office and we will get imaging. Exercises and  stretches printed for patient. Call if worsening or not improving.  - cyclobenzaprine (FLEXERIL) 5 MG tablet; Take 1 tablet (5 mg total) by mouth 3 (three) times daily as needed for muscle spasms.  Dispense: 30 tablet; Refill: 1 - methylPREDNISolone (MEDROL) 4 MG TBPK tablet; 6 day taper; take as directed on package instructions  Dispense: 21 tablet; Refill: 0  2. Muscle spasm See above medical treatment plan. - cyclobenzaprine (FLEXERIL) 5 MG tablet; Take 1 tablet (5 mg total) by mouth 3 (three) times daily as needed for muscle spasms.  Dispense: 30 tablet; Refill: 1    Patient seen and examined by Joycelyn Man PA-C, note scribed by Sheliah Hatch, NCMA Margaretann Loveless, PA-C  [facility]

## 2018-04-18 ENCOUNTER — Ambulatory Visit
Admission: RE | Admit: 2018-04-18 | Discharge: 2018-04-18 | Disposition: A | Discharge status: Home / Self Care | Payer: 59 | Source: Ambulatory Visit | Attending: Physician Assistant | Admitting: Physician Assistant

## 2018-04-18 ENCOUNTER — Other Ambulatory Visit: Payer: Self-pay

## 2018-04-18 ENCOUNTER — Telehealth: Payer: Self-pay | Admitting: Physician Assistant

## 2018-04-18 DIAGNOSIS — M545 Low back pain, unspecified: Secondary | ICD-10-CM

## 2018-04-18 DIAGNOSIS — M542 Cervicalgia: Secondary | ICD-10-CM | POA: Diagnosis present

## 2018-04-18 DIAGNOSIS — S161XXD Strain of muscle, fascia and tendon at neck level, subsequent encounter: Secondary | ICD-10-CM | POA: Insufficient documentation

## 2018-04-18 MED ORDER — MELOXICAM 15 MG PO TABS: 15.0000 mg | ORAL_TABLET | Freq: Every day | ORAL | 0 refills | Status: AC

## 2018-04-18 NOTE — Telephone Encounter (Signed)
Pt called saying she is still having pain in her neck.  She has taken the steroid and muscle relaxer.  Please advise  CB#  [phone]  Thanks Barth Kirks

## 2018-04-18 NOTE — Telephone Encounter (Signed)
I will order xray for her. I have also refilled meloxicam for her to restart for the time being.  Also if xray is normal would she want to try PT?

## 2018-04-18 NOTE — Telephone Encounter (Signed)
Patient advised as below. Patient verbalizes understanding and is in agreement with treatment plan.  

## 2018-04-20 ENCOUNTER — Telehealth: Payer: Self-pay

## 2018-04-20 DIAGNOSIS — M47812 Spondylosis without myelopathy or radiculopathy, cervical region: Secondary | ICD-10-CM

## 2018-04-20 DIAGNOSIS — M542 Cervicalgia: Secondary | ICD-10-CM

## 2018-04-20 NOTE — Telephone Encounter (Signed)
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 This would be something that may require more invasive intervention if hurting enough. We could try PT in the meantime to see if they can help. I can also refer you to ortho spine and let them investigate and treat. Thoughts?

## 2018-04-20 NOTE — Telephone Encounter (Signed)
Ok I will place referral for her

## 2018-04-20 NOTE — Telephone Encounter (Signed)
Pt advised.  She states she would like to proceed with PT and see if that helps before being referred to Ortho.   Pt stated the Prednisone and Cyclobenzaprine is not helping with the pain.  I advised her you sent in Meloxicam  04/18/2018.  She did not realize that but will pick it up today and start it.   Thanks,   -Vernona Rieger

## 2018-06-05 ENCOUNTER — Encounter: Payer: Self-pay | Admitting: Physician Assistant

## 2018-06-05 ENCOUNTER — Ambulatory Visit (INDEPENDENT_AMBULATORY_CARE_PROVIDER_SITE_OTHER): Payer: 59 | Admitting: Physician Assistant

## 2018-06-05 ENCOUNTER — Other Ambulatory Visit: Payer: Self-pay

## 2018-06-05 VITALS — BP 150/102 | HR 76 | Temp 98.1°F | Resp 16 | Wt 133.0 lb

## 2018-06-05 DIAGNOSIS — J301 Allergic rhinitis due to pollen: Secondary | ICD-10-CM

## 2018-06-05 DIAGNOSIS — L03115 Cellulitis of right lower limb: Secondary | ICD-10-CM

## 2018-06-05 DIAGNOSIS — R0789 Other chest pain: Secondary | ICD-10-CM | POA: Diagnosis not present

## 2018-06-05 MED ORDER — CEPHALEXIN 500 MG PO CAPS: 500.0000 mg | ORAL_CAPSULE | Freq: Two times a day (BID) | ORAL | 0 refills | Status: DC

## 2018-06-05 MED ORDER — FLUTICASONE FUROATE 27.5 MCG/SPRAY NA SUSP: 2.0000 | Freq: Every day | NASAL | 12 refills | Status: AC

## 2018-06-05 NOTE — Progress Notes (Signed)
[name] Female    DOB: May 10, 1965   53 y.o.   MRN: [id number] Visit Date: 06/05/2018  Today's Provider: Margaretann Loveless, PA-C   Chief Complaint  Patient presents with  . Insect Bite   Subjective:     HPI   Patient states she had a bite on her right ankle 3 weeks ago. Patient states at first the spot was painful. She used neosporin and it got somewhat better. Patient states the spot is still red and slightly painful with some surrounding swelling.  Patient states this morning she was having slight chest pain/discomfort. Patient also states she developed a slight dry cough that has been present x 2 weeks. She states it is more of a throat clearing cough than deep.    Allergies  Allergen Reactions  . Biaxin [Clarithromycin] Other (See Comments)  . Contrast Media [Iodinated Diagnostic Agents]     Pt is able to tolerate betadine and eat shrimp without a problem.  Radford Pax [Oxycodone-Acetaminophen]      Current Outpatient Medications:  .  aspirin-acetaminophen-caffeine (EXCEDRIN MIGRAINE) 250-250-65 MG per tablet, Take 1 tablet by mouth every 8 (eight) hours as needed (for migraine/headaches.). AS NEEDED, Disp: , Rfl:  .  meloxicam (MOBIC) 15 MG tablet, Take 1 tablet (15 mg total) by mouth daily., Disp: 90 tablet, Rfl: 0 .  sucralfate (CARAFATE) 1 g tablet, Take 1 tablet (1 g total) by mouth 4 (four) times daily -  with meals and at bedtime., Disp: 56 tablet, Rfl: 0 .  cyclobenzaprine (FLEXERIL) 5 MG tablet, Take 1 tablet (5 mg total) by mouth 3 (three) times daily as needed for muscle spasms. (Patient not taking: Reported on 06/05/2018), Disp: 30 tablet, Rfl: 1 .  methylPREDNISolone (MEDROL) 4 MG TBPK tablet, 6 day taper; take as directed on package instructions (Patient not taking: Reported on 06/05/2018), Disp: 21 tablet, Rfl: 0  Review of Systems  Constitutional: Negative for appetite change, chills, fatigue and fever.  Respiratory: Negative for chest  tightness and shortness of breath.   Cardiovascular: Negative for chest pain and palpitations.  Gastrointestinal: Negative for abdominal pain, nausea and vomiting.  Neurological: Negative for dizziness and weakness.    Social History   Tobacco Use  . Smoking status: Never Smoker  . Smokeless tobacco: Never Used  Substance Use Topics  . Alcohol use: No      Objective:   BP (!) 165/144 (BP Location: Right Arm, Patient Position: Sitting, Cuff Size: Large)   Pulse 88   Temp 98.1 F (36.7 C) (Oral)   Resp 16   Wt 133 lb (60.3 kg)   SpO2 98%   BMI 20.83 kg/m  Vitals:   06/05/18 1400  BP: (!) 165/144  Pulse: 88  Resp: 16  Temp: 98.1 F (36.7 C)  TempSrc: Oral  SpO2: 98%  Weight: 133 lb (60.3 kg)     Physical Exam Vitals signs reviewed.  Constitutional:      General: She is not in acute distress.    Appearance: She is well-developed and normal weight. She is not ill-appearing or diaphoretic.  HENT:     Head: Normocephalic and atraumatic.     Right Ear: Hearing, tympanic membrane, ear canal and external ear normal.     Left Ear: Hearing, tympanic membrane, ear canal and external ear normal.     Nose: Nose normal.     Mouth/Throat:     Pharynx: Uvula midline. No oropharyngeal exudate.  Eyes:  General: No scleral icterus.       Right eye: No discharge.        Left eye: No discharge.     Conjunctiva/sclera: Conjunctivae normal.     Pupils: Pupils are equal, round, and reactive to light.  Neck:     Musculoskeletal: Normal range of motion and neck supple.     Thyroid: No thyromegaly.     Trachea: No tracheal deviation.  Cardiovascular:     Rate and Rhythm: Normal rate and regular rhythm.     Heart sounds: Normal heart sounds. No murmur. No friction rub. No gallop.   Pulmonary:     Effort: Pulmonary effort is normal. No respiratory distress.     Breath sounds: Normal breath sounds. No stridor. No decreased breath sounds, wheezing or rales.  Chest:     Chest  wall: No tenderness.  Lymphadenopathy:     Cervical: No cervical adenopathy.  Skin:    General: Skin is warm and dry.     Capillary Refill: Capillary refill takes less than 2 seconds.  Neurological:     Mental Status: She is alert.         Assessment & Plan    1. Other chest pain EKG was unremarkable today showing NSR rate of 86 with possible left atrial enlargement. Discussed BP and patient desires to monitor BP as she feels it was elevated as she was very anxious. She is scheduled to return at the end of the month for her CPE, so we will recheck then.  - EKG 12-Lead  2. Cellulitis of right lower extremity Surrounding cellulitis of a small bite wound on the lower extremity. Will treat with keflex as below. Call if not improving.  - cephALEXin (KEFLEX) 500 MG capsule; Take 1 capsule (500 mg total) by mouth 2 (two) times daily.  Dispense: 10 capsule; Refill: 0  3. Seasonal allergic rhinitis due to pollen Some mild post nasal drainage noted on exam, but otherwise unremarkable. Advised to try flonase sensimist to avoid side effects. Use once or twice daily. Call if worsening. - fluticasone (FLONASE SENSIMIST) 27.5 MCG/SPRAY nasal spray; Place 2 sprays into the nose daily.  Dispense: 10 g; Refill: 12     Margaretann LovelessJennifer M Lilyann Gravelle, PA-C  [facility]

## 2018-06-05 NOTE — Patient Instructions (Signed)
Allergic Rhinitis, Adult Allergic rhinitis is an allergic reaction that affects the mucous membrane inside the nose. It causes sneezing, a runny or stuffy nose, and the feeling of mucus going down the back of the throat (postnasal drip). Allergic rhinitis can be mild to severe. There are two types of allergic rhinitis:  Seasonal. This type is also called hay fever. It happens only during certain seasons.  Perennial. This type can happen at any time of the year. What are the causes? This condition happens when the body's defense system (immune system) responds to certain harmless substances called allergens as though they were germs.  Seasonal allergic rhinitis is triggered by pollen, which can come from grasses, trees, and weeds. Perennial allergic rhinitis may be caused by:  House dust mites.  Pet dander.  Mold spores. What are the signs or symptoms? Symptoms of this condition include:  Sneezing.  Runny or stuffy nose (nasal congestion).  Postnasal drip.  Itchy nose.  Tearing of the eyes.  Trouble sleeping.  Daytime sleepiness. How is this diagnosed? This condition may be diagnosed based on:  Your medical history.  A physical exam.  Tests to check for related conditions, such as: ? Asthma. ? Pink eye. ? Ear infection. ? Upper respiratory infection.  Tests to find out which allergens trigger your symptoms. These may include skin or blood tests. How is this treated? There is no cure for this condition, but treatment can help control symptoms. Treatment may include:  Taking medicines that block allergy symptoms, such as antihistamines. Medicine may be given as a shot, nasal spray, or pill.  Avoiding the allergen.  Desensitization. This treatment involves getting ongoing shots until your body becomes less sensitive to the allergen. This treatment may be done if other treatments do not help.  If taking medicine and avoiding the allergen does not work, new, stronger  medicines may be prescribed. Follow these instructions at home:  Find out what you are allergic to. Common allergens include smoke, dust, and pollen.  Avoid the things you are allergic to. These are some things you can do to help avoid allergens: ? Replace carpet with wood, tile, or vinyl flooring. Carpet can trap dander and dust. ? Do not smoke. Do not allow smoking in your home. ? Change your heating and air conditioning filter at least once a month. ? During allergy season:  Keep windows closed as much as possible.  Plan outdoor activities when pollen counts are lowest. This is usually during the evening hours.  When coming indoors, change clothing and shower before sitting on furniture or bedding.  Take over-the-counter and prescription medicines only as told by your health care provider.  Keep all follow-up visits as told by your health care provider. This is important. Contact a health care provider if:  You have a fever.  You develop a persistent cough.  You make whistling sounds when you breathe (you wheeze).  Your symptoms interfere with your normal daily activities. Get help right away if:  You have shortness of breath. Summary  This condition can be managed by taking medicines as directed and avoiding allergens.  Contact your health care provider if you develop a persistent cough or fever.  During allergy season, keep windows closed as much as possible. This information is not intended to replace advice given to you by your health care provider. Make sure you discuss any questions you have with your health care provider. Document Released: 10/12/2000 Document Revised: 02/25/2016 Document Reviewed: 02/25/2016 Elsevier Interactive  Patient Education  2019 [employer]. Spider Bite  Spider bites are not common. When spider bites do happen, most do not cause serious health problems. There are only a few types of spider bites that can cause serious health problems.  What are the causes? A spider bite usually happens when a person accidentally makes contact with a spider in a way that traps the spider against the person's skin. What are the signs or symptoms? Symptoms may vary depending on the type of spider. Some spider bites may cause symptoms within 1 hour after the bite. For other spider bites, it may take 1-2 days for symptoms to develop. Common symptoms include:  Redness and swelling in the area of the bite.  Discomfort or pain in the area of the bite. A few types of spiders, such as the black widow spider or the brown recluse spider, can inject poison (venom) into a bite wound. This venom causes more serious symptoms. Symptoms of a venomous spider bite vary, and may include:  Muscle cramps.  Nausea, vomiting, or abdominal pain.  Fever.  A skin sore (lesion) that spreads. This can break into an open wound (skin ulcer).  Light-headedness or dizziness. How is this diagnosed? This condition may be diagnosed based on your symptoms and a physical exam. Your health care provider will ask about the history of your injury and any details you may have about the spider. This may help to determine what type of spider it was that bit you. How is this treated? Many spider bites do not require treatment. If needed, treatment may include:  Icing and keeping the bite area raised (elevated).  Over-the-counter or prescription medicines to help control symptoms.  A tetanus shot.  Antibiotic medicines. Follow these instructions at home: Medicines  Take or apply over-the-counter and prescription medicines only as told by your health care provider.  If you were prescribed an antibiotic medicine, take or apply it as told by your health care provider. Do not stop using the antibiotic even if your condition improves. General instructions  Do not scratch the bite area.  Keep the bite area clean and dry. Wash the bite area daily with soap and water as told  by your health care provider.  If directed, apply ice to the bite area. ? Put ice in a plastic bag. ? Place a towel between your skin and the bag. ? Leave the ice on for 20 minutes, 2-3 times per day.  Elevate the affected area above the level of your heart while you are sitting or lying down, if possible.  Keep all follow-up visits as told by your health care provider. This is important. Contact a health care provider if:  Your bite does not get better after 3 days of treatment.  Your bite turns black or purple.  You have increased redness, swelling, or pain at the site of the bite. Get help right away if:  You develop shortness of breath or chest pain.  You have fluid, blood, or pus coming from the bite area.  You have muscle cramps or painful muscle spasms.  You develop abdominal pain, nausea, or vomiting.  You feel unusually tired (fatigued) or sleepy. This information is not intended to replace advice given to you by your health care provider. Make sure you discuss any questions you have with your health care provider. Document Released: 02/25/2004 Document Revised: 09/14/2015 Document Reviewed: 06/04/2014 Elsevier Interactive Patient Education  2019 [employer].

## 2018-06-18 ENCOUNTER — Telehealth: Payer: Self-pay

## 2018-06-18 NOTE — Telephone Encounter (Signed)
Left message for patient to call regarding appointment on 06/26/2018. Needs to be rescheduled.

## 2018-06-26 ENCOUNTER — Encounter: Payer: Self-pay | Admitting: Physician Assistant

## 2018-06-29 ENCOUNTER — Encounter: Payer: Self-pay | Admitting: Physician Assistant

## 2018-06-29 ENCOUNTER — Other Ambulatory Visit: Payer: Self-pay

## 2018-06-29 ENCOUNTER — Ambulatory Visit (INDEPENDENT_AMBULATORY_CARE_PROVIDER_SITE_OTHER): Payer: 59 | Admitting: Physician Assistant

## 2018-06-29 VITALS — BP 143/99 | HR 84 | Temp 98.7°F | Resp 16 | Ht 66.5 in | Wt 133.8 lb

## 2018-06-29 DIAGNOSIS — M62838 Other muscle spasm: Secondary | ICD-10-CM

## 2018-06-29 DIAGNOSIS — Z1329 Encounter for screening for other suspected endocrine disorder: Secondary | ICD-10-CM

## 2018-06-29 DIAGNOSIS — I10 Essential (primary) hypertension: Secondary | ICD-10-CM

## 2018-06-29 DIAGNOSIS — Z1239 Encounter for other screening for malignant neoplasm of breast: Secondary | ICD-10-CM | POA: Diagnosis not present

## 2018-06-29 DIAGNOSIS — Z13220 Encounter for screening for lipoid disorders: Secondary | ICD-10-CM

## 2018-06-29 DIAGNOSIS — Z131 Encounter for screening for diabetes mellitus: Secondary | ICD-10-CM

## 2018-06-29 DIAGNOSIS — Z0000 Encounter for general adult medical examination without abnormal findings: Secondary | ICD-10-CM | POA: Diagnosis not present

## 2018-06-29 DIAGNOSIS — Z136 Encounter for screening for cardiovascular disorders: Secondary | ICD-10-CM

## 2018-06-29 DIAGNOSIS — S139XXA Sprain of joints and ligaments of unspecified parts of neck, initial encounter: Secondary | ICD-10-CM

## 2018-06-29 MED ORDER — CYCLOBENZAPRINE HCL 5 MG PO TABS: 5.0000 mg | ORAL_TABLET | Freq: Every day | ORAL | 1 refills | Status: DC

## 2018-06-29 NOTE — Progress Notes (Signed)
[name], Female    DOB: [birth year]68 y.o.   MRN: [id number] Visit Date: 06/29/2018  Today's Provider: Margaretann Loveless, PA-C   Chief Complaint  Patient presents with  . Annual Exam   Subjective:     Annual physical exam Emily Hurst is a 53 y.o. female who presents today for health maintenance and complete physical. She feels well. She reports exercising none. She reports she is sleeping fairly well. ----------------------------------------------------------------- PAP:06/15/2016 Normal,HPV-Negative Mammogram:07/13/2017-Normal Colonoscopy:11/09/2016   Review of Systems  Constitutional: Positive for diaphoresis.  HENT: Negative.   Eyes: Negative.   Respiratory: Positive for cough.   Cardiovascular: Negative.   Gastrointestinal: Negative.   Endocrine: Negative.   Genitourinary: Negative.   Musculoskeletal: Positive for back pain and neck pain.  Skin: Positive for wound.  Allergic/Immunologic: Negative.   Neurological: Negative.   Hematological: Negative.   Psychiatric/Behavioral: Negative.     Social History      She  reports that she has never smoked. She has never used smokeless tobacco. She reports that she does not drink alcohol or use drugs.       Social History   Socioeconomic History  . Marital status: Single    Spouse name: Not on file  . Number of children: 3  . Years of education: Not on file  . Highest education level: Not on file  Occupational History  . Not on file  Social Needs  . Financial resource strain: Not on file  . Food insecurity:    Worry: Not on file    Inability: Not on file  . Transportation needs:    Medical: Not on file    Non-medical: Not on file  Tobacco Use  . Smoking status: Never Smoker  . Smokeless tobacco: Never Used  Substance and Sexual Activity  . Alcohol use: No  . Drug use: No  . Sexual activity: Never    Birth control/protection: None  Lifestyle  . Physical activity:    Days per  week: Not on file    Minutes per session: Not on file  . Stress: Not on file  Relationships  . Social connections:    Talks on phone: Not on file    Gets together: Not on file    Attends religious service: Not on file    Active member of club or organization: Not on file    Attends meetings of clubs or organizations: Not on file    Relationship status: Not on file  Other Topics Concern  . Not on file  Social History Narrative  . Not on file    Past Medical History:  Diagnosis Date  . Chicken pox    34  . Family history of adverse reaction to anesthesia    pt is adopted, does not know family medical history.  Marland Kitchen Heart murmur    diagnosed at age 89.  Marland Kitchen History of kidney stones 2006  . Hypertension    pt is not being treated for hypertension, it is being watched.  . Migraine   . Spinal headache      Patient Active Problem List   Diagnosis Date Noted  . Encounter for screening colonoscopy 09/09/2016  . Encounter for screening mammogram for breast cancer 10/28/2014  . Body water dehydration 09/29/2014  . Early menopause 09/29/2014  . Vitamin D deficiency 09/25/2014    Past Surgical History:  Procedure Laterality Date  . CESAREAN SECTION    . COLONOSCOPY WITH PROPOFOL  N/A 11/09/2016   Procedure: COLONOSCOPY WITH PROPOFOL;  Surgeon: Earline MayotteByrnett, Jeffrey W, MD;  [facility];  Service: Endoscopy;  Laterality: N/A;  . ENDOMETRIAL ABLATION    . LAPAROSCOPIC APPENDECTOMY N/A 04/22/2016   Procedure: APPENDECTOMY LAPAROSCOPIC;  Surgeon: Earline MayotteJeffrey W Byrnett, MD;  Location: [facility] ORS;  Service: General;  Laterality: N/A;  . TUBAL LIGATION      Family History        Family Status  Relation Name Status  . Son  Alive  . Son  Alive  . Son  Alive       develomental delay        Her family history includes ADD / ADHD in her son; Deep vein thrombosis (age of onset: 3622) in her son; Healthy in her son. She was adopted.      Allergies  Allergen Reactions  . Biaxin  [Clarithromycin] Other (See Comments)  . Contrast Media [Iodinated Diagnostic Agents]     Pt is able to tolerate betadine and eat shrimp without a problem.  Radford Pax. Percocet [Oxycodone-Acetaminophen]      Current Outpatient Medications:  .  aspirin-acetaminophen-caffeine (EXCEDRIN MIGRAINE) 250-250-65 MG per tablet, Take 1 tablet by mouth every 8 (eight) hours as needed (for migraine/headaches.). AS NEEDED, Disp: , Rfl:  .  fluticasone (FLONASE SENSIMIST) 27.5 MCG/SPRAY nasal spray, Place 2 sprays into the nose daily., Disp: 10 g, Rfl: 12 .  meloxicam (MOBIC) 15 MG tablet, Take 1 tablet (15 mg total) by mouth daily., Disp: 90 tablet, Rfl: 0 .  cyclobenzaprine (FLEXERIL) 5 MG tablet, Take 1 tablet (5 mg total) by mouth 3 (three) times daily as needed for muscle spasms. (Patient not taking: Reported on 06/05/2018), Disp: 30 tablet, Rfl: 1 .  sucralfate (CARAFATE) 1 g tablet, Take 1 tablet (1 g total) by mouth 4 (four) times daily -  with meals and at bedtime. (Patient not taking: Reported on 06/29/2018), Disp: 56 tablet, Rfl: 0   Patient Care Team: Margaretann LovelessBurnette, Jennifer M, PA-C as PCP - General (Family Medicine) Malva LimesFisher, Donald E, MD as Referring Physician (Family Medicine) Earline MayotteByrnett, Jeffrey W, MD (General Surgery)    Objective:    Vitals: BP (!) 143/99 (BP Location: Left Arm, Patient Position: Sitting, Cuff Size: Large)   Pulse 84   Temp 98.7 F (37.1 C) (Oral)   Resp 16   Ht 5' 6.5" (1.689 m)   Wt 133 lb 12.8 oz (60.7 kg)   BMI 21.27 kg/m    Vitals:   06/29/18 1507  BP: (!) 143/99  Pulse: 84  Resp: 16  Temp: 98.7 F (37.1 C)  TempSrc: Oral  Weight: 133 lb 12.8 oz (60.7 kg)  Height: 5' 6.5" (1.689 m)     Physical Exam Vitals signs reviewed.  Constitutional:      General: She is not in acute distress.    Appearance: Normal appearance. She is well-developed and normal weight. She is not ill-appearing or diaphoretic.  HENT:     Head: Normocephalic and atraumatic.     Right Ear:  Tympanic membrane, ear canal and external ear normal.     Left Ear: Tympanic membrane, ear canal and external ear normal.     Nose: Nose normal.     Mouth/Throat:     Mouth: Mucous membranes are moist.     Pharynx: Oropharynx is clear. No oropharyngeal exudate.  Eyes:     General: No scleral icterus.       Right eye: No discharge.  Left eye: No discharge.     Extraocular Movements: Extraocular movements intact.     Conjunctiva/sclera: Conjunctivae normal.     Pupils: Pupils are equal, round, and reactive to light.  Neck:     Musculoskeletal: Normal range of motion and neck supple.     Thyroid: No thyromegaly.     Vascular: No carotid bruit or JVD.     Trachea: No tracheal deviation.  Cardiovascular:     Rate and Rhythm: Normal rate and regular rhythm.     Pulses: Normal pulses.     Heart sounds: Normal heart sounds. No murmur. No friction rub. No gallop.   Pulmonary:     Effort: Pulmonary effort is normal. No respiratory distress.     Breath sounds: Normal breath sounds. No wheezing or rales.  Chest:     Chest wall: No tenderness.  Abdominal:     General: Bowel sounds are normal. There is no distension.     Palpations: Abdomen is soft. There is no mass.     Tenderness: There is no abdominal tenderness. There is no guarding or rebound.  Musculoskeletal: Normal range of motion.        General: No tenderness.     Right lower leg: No edema.     Left lower leg: No edema.  Lymphadenopathy:     Cervical: No cervical adenopathy.  Skin:    General: Skin is warm and dry.     Capillary Refill: Capillary refill takes less than 2 seconds.     Findings: No rash.  Neurological:     General: No focal deficit present.     Mental Status: She is alert and oriented to person, place, and time. Mental status is at baseline.     Cranial Nerves: No cranial nerve deficit.  Psychiatric:        Mood and Affect: Mood normal.        Behavior: Behavior normal.        Thought Content: Thought  content normal.        Judgment: Judgment normal.      Depression Screen PHQ 2/9 Scores 06/29/2018 06/22/2017 06/13/2016 06/10/2015  PHQ - 2 Score 2 2 0 1  PHQ- 9 Score 5 5 0 -       Assessment & Plan:     Routine Health Maintenance and Physical Exam  Exercise Activities and Dietary recommendations Goals    . Exercise 150 minutes per week (moderate activity)       Immunization History  Administered Date(s) Administered  . Td 06/29/2006, 06/13/2016  . Tdap 06/29/2006    Health Maintenance  Topic Date Due  . INFLUENZA VACCINE  09/01/2018  . PAP SMEAR-Modifier  06/14/2019  . MAMMOGRAM  07/13/2019  . TETANUS/TDAP  06/14/2026  . COLONOSCOPY  11/10/2026  . HIV Screening  Completed     Discussed health benefits of physical activity, and encouraged her to engage in regular exercise appropriate for her age and condition.    1. Encounter for annual physical exam Normal physical exam today. Will check labs as below and f/u pending lab results. If labs are stable and WNL she will not need to have these rechecked for one year at her next annual physical exam. She is to call the office in the meantime if she has any acute issue, questions or concerns. - CBC with Differential/Platelet - Comprehensive metabolic panel - Hemoglobin A1c - Lipid panel - TSH  2. Breast cancer screening Breast exam today was normal. There  is unknown family history of breast cancer (adopted). She does perform regular self breast exams. Mammogram was ordered as below. Information for [facility] clinic was given to patient so she may schedule her mammogram at her convenience. - MM 3D SCREEN BREAST BILATERAL; Future  3. Encounter for lipid screening for cardiovascular disease Will check labs as below and f/u pending results. - Lipid panel  4. Screening for thyroid disorder Will check labs as below and f/u pending results. - TSH  5. Diabetes mellitus screening Will check labs as below and f/u  pending results. - Hemoglobin A1c  6. Muscle spasm Stable. Diagnosis pulled for medication refill. Continue current medical treatment plan. - cyclobenzaprine (FLEXERIL) 5 MG tablet; Take 1 tablet (5 mg total) by mouth at bedtime.  Dispense: 90 tablet; Refill: 1  7. Acute sprain of ligament of neck, initial encounter Stable. Diagnosis pulled for medication refill. Continue current medical treatment plan. - cyclobenzaprine (FLEXERIL) 5 MG tablet; Take 1 tablet (5 mg total) by mouth at bedtime.  Dispense: 90 tablet; Refill: 1  --------------------------------------------------------------------    Margaretann Loveless, PA-C  [facility]

## 2018-06-29 NOTE — Patient Instructions (Signed)

## 2018-07-02 MED ORDER — TRIAMTERENE-HCTZ 37.5-25 MG PO TABS: 1.0000 | ORAL_TABLET | Freq: Every day | ORAL | 3 refills | Status: DC

## 2018-07-03 LAB — LIPID PANEL
Chol/HDL Ratio: 2.7 ratio (ref 0.0–4.4)
Cholesterol, Total: 230 mg/dL — ABNORMAL HIGH (ref 100–199)
HDL: 84 mg/dL (ref >39)
LDL Calculated: 129 mg/dL — ABNORMAL HIGH (ref 0–99)
Triglycerides: 86 mg/dL (ref 0–149)
VLDL Cholesterol Cal: 17 mg/dL (ref 5–40)

## 2018-07-03 LAB — TSH: TSH: 3.21 u[IU]/mL (ref 0.450–4.500)

## 2018-07-03 LAB — COMPREHENSIVE METABOLIC PANEL
ALT: 17 IU/L (ref 0–32)
AST: 24 IU/L (ref 0–40)
Albumin/Globulin Ratio: 1.8 (ref 1.2–2.2)
Albumin: 4.6 g/dL (ref 3.8–4.9)
Alkaline Phosphatase: 75 IU/L (ref 39–117)
BUN/Creatinine Ratio: 19 (ref 9–23)
BUN: 19 mg/dL (ref 6–24)
Bilirubin Total: 0.3 mg/dL (ref 0.0–1.2)
CO2: 24 mmol/L (ref 20–29)
Calcium: 9.9 mg/dL (ref 8.7–10.2)
Chloride: 102 mmol/L (ref 96–106)
Creatinine, Ser: 1.01 mg/dL — ABNORMAL HIGH (ref 0.57–1.00)
GFR calc Af Amer: 74 mL/min/{1.73_m2} (ref >59)
GFR calc non Af Amer: 64 mL/min/{1.73_m2} (ref >59)
Globulin, Total: 2.6 g/dL (ref 1.5–4.5)
Glucose: 89 mg/dL (ref 65–99)
Potassium: 3.9 mmol/L (ref 3.5–5.2)
Sodium: 139 mmol/L (ref 134–144)
Total Protein: 7.2 g/dL (ref 6.0–8.5)

## 2018-07-03 LAB — CBC WITH DIFFERENTIAL/PLATELET
Basophils Absolute: 0.1 10*3/uL (ref 0.0–0.2)
Basos: 1 %
EOS (ABSOLUTE): 0.2 10*3/uL (ref 0.0–0.4)
Eos: 6 %
Hematocrit: 36.9 % (ref 34.0–46.6)
Hemoglobin: 12.6 g/dL (ref 11.1–15.9)
Immature Grans (Abs): 0 10*3/uL (ref 0.0–0.1)
Immature Granulocytes: 0 %
Lymphocytes Absolute: 1.5 10*3/uL (ref 0.7–3.1)
Lymphs: 38 %
MCH: 30.7 pg (ref 26.6–33.0)
MCHC: 34.1 g/dL (ref 31.5–35.7)
MCV: 90 fL (ref 79–97)
Monocytes Absolute: 0.4 10*3/uL (ref 0.1–0.9)
Monocytes: 11 %
Neutrophils Absolute: 1.8 10*3/uL (ref 1.4–7.0)
Neutrophils: 44 %
Platelets: 257 10*3/uL (ref 150–450)
RBC: 4.1 x10E6/uL (ref 3.77–5.28)
RDW: 12.5 % (ref 11.7–15.4)
WBC: 4 10*3/uL (ref 3.4–10.8)

## 2018-07-03 LAB — HEMOGLOBIN A1C
Est. average glucose Bld gHb Est-mCnc: 103 mg/dL
Hgb A1c MFr Bld: 5.2 % (ref 4.8–5.6)

## 2018-07-04 ENCOUNTER — Telehealth: Payer: Self-pay

## 2018-07-04 NOTE — Telephone Encounter (Signed)
LMTCB

## 2018-07-04 NOTE — Telephone Encounter (Signed)
-----   Message from Margaretann Loveless, [place] sent at 07/04/2018 11:21 AM EDT ----- Blood count is normal. Kidney and liver function is normal. Sodium, potassium and calcium are normal. A1c/sugar is normal. Cholesterol borderline high, but improved from last year. Thyroid is normal.

## 2018-07-06 ENCOUNTER — Telehealth: Payer: Self-pay

## 2018-07-06 NOTE — Telephone Encounter (Signed)
Patient called back in reference to message left, results given.

## 2018-07-06 NOTE — Telephone Encounter (Signed)
Patient called back and results given to patient

## 2018-07-11 ENCOUNTER — Telehealth: Payer: Self-pay

## 2018-07-11 DIAGNOSIS — M545 Low back pain, unspecified: Secondary | ICD-10-CM

## 2018-07-11 DIAGNOSIS — G8929 Other chronic pain: Secondary | ICD-10-CM

## 2018-07-11 MED ORDER — METHYLPREDNISOLONE 4 MG PO TBPK: ORAL_TABLET | ORAL | 0 refills | Status: DC

## 2018-07-11 NOTE — Telephone Encounter (Signed)
Patient states that she is still having back pain. Cyclobenzaprine is not working.

## 2018-07-11 NOTE — Telephone Encounter (Signed)
I will send in a medrol dose pak. This is a 6 day steroid taper. Do not take meloxicam, aleve (naproxen) or advil/motrin (ibuprofen) while on the steroid. Only can take tylenol with medrol. Continue flexeril as well. Call if still not improving and would recommend PT or orthopedics referral or both.

## 2018-07-11 NOTE — Telephone Encounter (Signed)
Please advise 

## 2018-07-11 NOTE — Telephone Encounter (Signed)
Patient advised as below. Patient verbalizes understanding and is in agreement with treatment plan.  

## 2018-07-16 ENCOUNTER — Telehealth: Payer: Self-pay

## 2018-07-16 NOTE — Telephone Encounter (Signed)
Patient calling that Last Wednesday she was a working with client who thinks she may had been exposed to a person with covid-19. Patient report that her client is not sure if she was exposed health department has not contacted her yet and that when she met with her client  She had a mask on and they sat across from each other at a picnic table. Reports that she call the health nurse on call yesterday and was advised to call the office today. Pateint reports that she is going to self quarantine for 14 days but that she is needing a letter for work.

## 2018-07-16 NOTE — Telephone Encounter (Signed)
Patient advised as directed below. 

## 2018-07-16 NOTE — Telephone Encounter (Signed)
Work note sent via [employer]. Call if any symptoms develop.

## 2018-07-16 NOTE — Telephone Encounter (Signed)
Patient is requesting a call back to follow up from speaking with on call nurse yesterday regarding a possible exposure to COVID-19 and recommendations. She also needs a work excuse. CB#[phone]

## 2018-07-19 ENCOUNTER — Other Ambulatory Visit: Payer: Self-pay

## 2018-07-19 ENCOUNTER — Ambulatory Visit (INDEPENDENT_AMBULATORY_CARE_PROVIDER_SITE_OTHER): Payer: 59 | Admitting: Family Medicine

## 2018-07-19 ENCOUNTER — Encounter: Payer: Self-pay | Admitting: Family Medicine

## 2018-07-19 DIAGNOSIS — B0059 Other herpesviral disease of eye: Secondary | ICD-10-CM

## 2018-07-19 MED ORDER — VALACYCLOVIR HCL 500 MG PO TABS: 500.0000 mg | ORAL_TABLET | Freq: Two times a day (BID) | ORAL | 0 refills | Status: AC

## 2018-07-19 NOTE — Progress Notes (Signed)
[name] Female    DOB: 1965/10/22   53 y.o.   MRN: [id number] Visit Date: 07/19/2018  Today's Provider: Simona Huh Gabi Mcfate, PA   Irritation left upper outer eyelid.  Subjective:     HPI  This 53 year old female developed left upper outer eyelid itching Monday, 07-16-18. Tried Neosporin and cold compress when she noticed some swelling. No vision changes or drainage from the eye. No known injury or insect bite/sting. No fever or respiratory symptoms. Finished a methylprednisolone taper 2 days ago for back pain issues.   Virtual Visit via Video Note  I connected with Leonora Gores Spilman on 07/19/18 at  3:20 PM EDT by a video enabled telemedicine application and verified that I am speaking with the correct person using two identifiers.  Location: Patient: Home Provider: Office   I discussed the limitations of evaluation and management by telemedicine and the availability of in person appointments. The patient expressed understanding and agreed to proceed.   Past Medical History:  Diagnosis Date  . Chicken pox    34  . Family history of adverse reaction to anesthesia    pt is adopted, does not know family medical history.  Marland Kitchen Heart murmur    diagnosed at age 32.  Marland Kitchen History of kidney stones 2006  . Hypertension    pt is not being treated for hypertension, it is being watched.  . Migraine   . Spinal headache    Past Surgical History:  Procedure Laterality Date  . CESAREAN SECTION    . COLONOSCOPY WITH PROPOFOL N/A 11/09/2016   Procedure: COLONOSCOPY WITH PROPOFOL;  Surgeon: Robert Bellow, MD;  Location: [facility] ENDOSCOPY;  Service: Endoscopy;  Laterality: N/A;  . ENDOMETRIAL ABLATION    . LAPAROSCOPIC APPENDECTOMY N/A 04/22/2016   Procedure: APPENDECTOMY LAPAROSCOPIC;  Surgeon: Robert Bellow, MD;  Location: [facility] ORS;  Service: General;  Laterality: N/A;  . TUBAL LIGATION     Family History  Adopted: Yes  Problem Relation Age of Onset  . Deep vein  thrombosis Son 22  . Healthy Son   . ADD / ADHD Son    Allergies  Allergen Reactions  . Biaxin [Clarithromycin] Other (See Comments)  . Contrast Media [Iodinated Diagnostic Agents]     Pt is able to tolerate betadine and eat shrimp without a problem.  Richardo Hanks [Oxycodone-Acetaminophen]    Current Outpatient Medications on File Prior to Visit  Medication Sig Dispense Refill  . aspirin-acetaminophen-caffeine (EXCEDRIN MIGRAINE) 250-250-65 MG per tablet Take 1 tablet by mouth every 8 (eight) hours as needed (for migraine/headaches.). AS NEEDED    . cyclobenzaprine (FLEXERIL) 5 MG tablet Take 1 tablet (5 mg total) by mouth at bedtime. 90 tablet 1  . fluticasone (FLONASE SENSIMIST) 27.5 MCG/SPRAY nasal spray Place 2 sprays into the nose daily. 10 g 12  . meloxicam (MOBIC) 15 MG tablet Take 1 tablet (15 mg total) by mouth daily. 90 tablet 0  . methylPREDNISolone (MEDROL) 4 MG TBPK tablet 6 day taper; take as directed on package instructions 21 tablet 0  . triamterene-hydrochlorothiazide (MAXZIDE-25) 37.5-25 MG tablet Take 1 tablet by mouth daily. 90 tablet 3   No current facility-administered medications on file prior to visit.    Observations/Objective: WDWN female in no apparent distress.  Head: Normocephalic, atraumatic. Eye: Pink swollen apparent small pinpoint blisters on the left upper outer corner of the eyelid. No scleral or conjunctival erythema. No drainage noted. Neck: Supple, NROM Respiratory: No  apparent distress Psych: Normal mood and affect   Assessment and Plan: 1. Herpes simplex of eyelid Onset today after finishing the steroid taper 2 days ago. Some itch and pain to this lesion on the left upper outer eyelid. Will treat with Valtrex and should protect from extra sun exposure. Should not rub the eye and notify us if vision is a problem or drainage develops. - valACYclovir (VALTREX) 500 MG tablet; Take 1 tablet (500 mg total) by mouth 2 (two) times daily.  Dispense: 14  tablet; Refill: 0   Follow Up Instructions:    I discussed the assessment and treatment plan with the patient. The patient was provided an opportunity to ask questions and all were answered. The patient agreed with the plan and demonstrated an understanding of the instructions.   The patient was advised to call back or seek an in-person evaluation if the symptoms worsen or if the condition fails to improve as anticipated.  I provided 14 minutes of non-face-to-face time during this encounter.  Dortha Kernennis Jaselle Pryer, PA  [facility]

## 2018-07-31 ENCOUNTER — Ambulatory Visit
Admission: RE | Admit: 2018-07-31 | Discharge: 2018-07-31 | Disposition: A | Discharge status: Home / Self Care | Payer: 59 | Source: Ambulatory Visit | Attending: Physician Assistant | Admitting: Physician Assistant

## 2018-07-31 ENCOUNTER — Other Ambulatory Visit: Payer: Self-pay

## 2018-07-31 DIAGNOSIS — Z1231 Encounter for screening mammogram for malignant neoplasm of breast: Secondary | ICD-10-CM | POA: Insufficient documentation

## 2018-07-31 DIAGNOSIS — Z1239 Encounter for other screening for malignant neoplasm of breast: Secondary | ICD-10-CM

## 2018-08-01 ENCOUNTER — Telehealth: Payer: Self-pay

## 2018-08-01 NOTE — Telephone Encounter (Signed)
-----   Message from Jennifer M Burnette, PA-C sent at 08/01/2018 12:39 PM EDT ----- Normal mammogram. Repeat screening in one year. 

## 2018-08-01 NOTE — Telephone Encounter (Signed)
Patient advised as directed below. 

## 2018-10-25 ENCOUNTER — Encounter: Payer: Self-pay | Admitting: Physician Assistant

## 2018-10-25 DIAGNOSIS — M62838 Other muscle spasm: Secondary | ICD-10-CM

## 2018-10-25 DIAGNOSIS — S139XXA Sprain of joints and ligaments of unspecified parts of neck, initial encounter: Secondary | ICD-10-CM

## 2018-10-26 ENCOUNTER — Telehealth: Payer: Self-pay

## 2018-10-26 NOTE — Telephone Encounter (Signed)
Patient states that she sent a MyChart message in yesterday regarding her back pain. She never received a response. Patient is having extreme back pain and can barely move.

## 2018-10-26 NOTE — Telephone Encounter (Signed)
See mychart.  

## 2018-10-29 MED ORDER — CYCLOBENZAPRINE HCL 5 MG PO TABS: 5.0000 mg | ORAL_TABLET | Freq: Three times a day (TID) | ORAL | 0 refills | Status: AC | PRN

## 2018-10-29 NOTE — Addendum Note (Signed)
Addended by: Virginia Crews on: 10/29/2018 09:44 AM   Modules accepted: Orders

## 2019-02-19 ENCOUNTER — Ambulatory Visit: Payer: Managed Care, Other (non HMO) | Attending: Internal Medicine

## 2019-02-19 DIAGNOSIS — Z20822 Contact with and (suspected) exposure to covid-19: Secondary | ICD-10-CM

## 2019-02-20 LAB — NOVEL CORONAVIRUS, NAA: SARS-CoV-2, NAA: NOT DETECTED

## 2019-03-15 NOTE — Progress Notes (Deleted)
       [name] Female    DOB: Mar 11, 1965   54 y.o.   MRN: [id number] Visit Date: 03/15/2019  Today's Provider: Margaretann Loveless, PA-C   No chief complaint on file.  Subjective:     HPI  Allergies  Allergen Reactions  . Biaxin [Clarithromycin] Other (See Comments)  . Contrast Media [Iodinated Diagnostic Agents]     Pt is able to tolerate betadine and eat shrimp without a problem.  Radford Pax [Oxycodone-Acetaminophen]      Current Outpatient Medications:  .  aspirin-acetaminophen-caffeine (EXCEDRIN MIGRAINE) 250-250-65 MG per tablet, Take 1 tablet by mouth every 8 (eight) hours as needed (for migraine/headaches.). AS NEEDED, Disp: , Rfl:  .  cyclobenzaprine (FLEXERIL) 5 MG tablet, Take 1 tablet (5 mg total) by mouth 3 (three) times daily as needed for muscle spasms., Disp: 30 tablet, Rfl: 0 .  fluticasone (FLONASE SENSIMIST) 27.5 MCG/SPRAY nasal spray, Place 2 sprays into the nose daily., Disp: 10 g, Rfl: 12 .  meloxicam (MOBIC) 15 MG tablet, Take 1 tablet (15 mg total) by mouth daily., Disp: 90 tablet, Rfl: 0 .  methylPREDNISolone (MEDROL) 4 MG TBPK tablet, 6 day taper; take as directed on package instructions, Disp: 21 tablet, Rfl: 0 .  triamterene-hydrochlorothiazide (MAXZIDE-25) 37.5-25 MG tablet, Take 1 tablet by mouth daily., Disp: 90 tablet, Rfl: 3 .  valACYclovir (VALTREX) 500 MG tablet, Take 1 tablet (500 mg total) by mouth 2 (two) times daily., Disp: 14 tablet, Rfl: 0  Review of Systems  Social History   Tobacco Use  . Smoking status: Never Smoker  . Smokeless tobacco: Never Used  Substance Use Topics  . Alcohol use: No      Objective:   There were no vitals taken for this visit. There were no vitals filed for this visit.There is no height or weight on file to calculate BMI.   Physical Exam   No results found for any visits on 03/18/19.     Assessment & Plan        Margaretann Loveless, PA-C  [facility]

## 2019-03-18 ENCOUNTER — Encounter: Payer: Self-pay | Admitting: Physician Assistant

## 2019-03-18 ENCOUNTER — Ambulatory Visit: Payer: Managed Care, Other (non HMO) | Admitting: Physician Assistant

## 2019-03-18 ENCOUNTER — Other Ambulatory Visit: Payer: Self-pay

## 2019-03-18 ENCOUNTER — Other Ambulatory Visit (HOSPITAL_COMMUNITY)
Admission: RE | Admit: 2019-03-18 | Discharge: 2019-03-18 | Disposition: A | Discharge status: Home / Self Care | Payer: Managed Care, Other (non HMO) | Source: Ambulatory Visit | Attending: Physician Assistant | Admitting: Physician Assistant

## 2019-03-18 VITALS — BP 169/90 | HR 85 | Temp 97.6°F | Resp 16 | Ht 66.5 in | Wt 138.5 lb

## 2019-03-18 DIAGNOSIS — Z0000 Encounter for general adult medical examination without abnormal findings: Secondary | ICD-10-CM | POA: Diagnosis not present

## 2019-03-18 DIAGNOSIS — Z1239 Encounter for other screening for malignant neoplasm of breast: Secondary | ICD-10-CM | POA: Diagnosis not present

## 2019-03-18 DIAGNOSIS — Z124 Encounter for screening for malignant neoplasm of cervix: Secondary | ICD-10-CM

## 2019-03-18 DIAGNOSIS — E559 Vitamin D deficiency, unspecified: Secondary | ICD-10-CM

## 2019-03-18 DIAGNOSIS — N898 Other specified noninflammatory disorders of vagina: Secondary | ICD-10-CM | POA: Diagnosis present

## 2019-03-18 DIAGNOSIS — B9689 Other specified bacterial agents as the cause of diseases classified elsewhere: Secondary | ICD-10-CM

## 2019-03-18 DIAGNOSIS — N760 Acute vaginitis: Secondary | ICD-10-CM

## 2019-03-18 MED ORDER — METRONIDAZOLE 500 MG PO TABS: 500.0000 mg | ORAL_TABLET | Freq: Three times a day (TID) | ORAL | 0 refills | Status: DC

## 2019-03-18 NOTE — Progress Notes (Signed)
[name], Female    DOB: 1965/08/11, 54 y.o.   MRN: [id number] Visit Date: 03/18/2019  Today's Provider: Mar Daring, PA-C   Chief Complaint  Patient presents with  . Annual Exam   Subjective:     Annual physical exam Emily Hurst is a 54 y.o. female who presents today for health maintenance and complete physical. She feels well. She reports exercising none. She reports she is sleeping fairly well. -----------------------------------------------------------------   Review of Systems  Constitutional: Negative for appetite change, chills, fatigue and fever.  HENT: Negative.   Eyes: Negative.   Respiratory: Negative for chest tightness and shortness of breath.   Cardiovascular: Negative for chest pain and palpitations.  Gastrointestinal: Negative for abdominal pain, nausea and vomiting.  Endocrine: Negative.  Negative for polyuria.  Genitourinary: Positive for vaginal discharge (having a brownish-yellow-green discharge over the last month or so). Negative for frequency, genital sores, pelvic pain, vaginal bleeding and vaginal pain.  Musculoskeletal: Positive for back pain. Negative for arthralgias, gait problem and myalgias.  Skin: Negative.   Allergic/Immunologic: Negative.   Neurological: Negative for dizziness, weakness, light-headedness, numbness and headaches.  Hematological: Negative.   Psychiatric/Behavioral: Negative.     Social History      She  reports that she has never smoked. She has never used smokeless tobacco. She reports that she does not drink alcohol or use drugs.       Social History   Socioeconomic History  . Marital status: Married    Spouse name: Not on file  . Number of children: 3  . Years of education: Not on file  . Highest education level: Not on file  Occupational History  . Not on file  Tobacco Use  . Smoking status: Never Smoker  . Smokeless tobacco: Never Used  Substance and Sexual Activity  . Alcohol  use: No  . Drug use: No  . Sexual activity: Never    Birth control/protection: None  Other Topics Concern  . Not on file  Social History Narrative  . Not on file   Social Determinants of Health   Financial Resource Strain:   . Difficulty of Paying Living Expenses: Not on file  Food Insecurity:   . Worried About Charity fundraiser in the Last Year: Not on file  . Ran Out of Food in the Last Year: Not on file  Transportation Needs:   . Lack of Transportation (Medical): Not on file  . Lack of Transportation (Non-Medical): Not on file  Physical Activity:   . Days of Exercise per Week: Not on file  . Minutes of Exercise per Session: Not on file  Stress:   . Feeling of Stress : Not on file  Social Connections:   . Frequency of Communication with Friends and Family: Not on file  . Frequency of Social Gatherings with Friends and Family: Not on file  . Attends Religious Services: Not on file  . Active Member of Clubs or Organizations: Not on file  . Attends Archivist Meetings: Not on file  . Marital Status: Not on file    Past Medical History:  Diagnosis Date  . Chicken pox    34  . Family history of adverse reaction to anesthesia    pt is adopted, does not know family medical history.  Marland Kitchen Heart murmur    diagnosed at age 60.  Marland Kitchen History of kidney stones 2006  . Hypertension    pt  is not being treated for hypertension, it is being watched.  . Migraine   . Spinal headache      Patient Active Problem List   Diagnosis Date Noted  . Encounter for screening colonoscopy 09/09/2016  . Encounter for screening mammogram for breast cancer 10/28/2014  . Body water dehydration 09/29/2014  . Early menopause 09/29/2014  . Vitamin D deficiency 09/25/2014    Past Surgical History:  Procedure Laterality Date  . CESAREAN SECTION    . COLONOSCOPY WITH PROPOFOL N/A 11/09/2016   Procedure: COLONOSCOPY WITH PROPOFOL;  Surgeon: Earline Mayotte, MD;  Location: [facility]  ENDOSCOPY;  Service: Endoscopy;  Laterality: N/A;  . ENDOMETRIAL ABLATION    . LAPAROSCOPIC APPENDECTOMY N/A 04/22/2016   Procedure: APPENDECTOMY LAPAROSCOPIC;  Surgeon: Earline Mayotte, MD;  Location: [facility] ORS;  Service: General;  Laterality: N/A;  . TUBAL LIGATION      Family History        Family Status  Relation Name Status  . Son  Alive  . Son  Alive  . Son  Alive       develomental delay  . Neg Hx  (Not Specified)        Her family history includes ADD / ADHD in her son; Deep vein thrombosis (age of onset: 53) in her son; Healthy in her son. There is no history of Breast cancer. She was adopted.      Allergies  Allergen Reactions  . Biaxin [Clarithromycin] Other (See Comments)  . Contrast Media [Iodinated Diagnostic Agents]     Pt is able to tolerate betadine and eat shrimp without a problem.  Radford Pax [Oxycodone-Acetaminophen]      Current Outpatient Medications:  .  aspirin-acetaminophen-caffeine (EXCEDRIN MIGRAINE) 250-250-65 MG per tablet, Take 1 tablet by mouth every 8 (eight) hours as needed (for migraine/headaches.). AS NEEDED, Disp: , Rfl:  .  cyclobenzaprine (FLEXERIL) 5 MG tablet, Take 1 tablet (5 mg total) by mouth 3 (three) times daily as needed for muscle spasms., Disp: 30 tablet, Rfl: 0 .  fluticasone (FLONASE SENSIMIST) 27.5 MCG/SPRAY nasal spray, Place 2 sprays into the nose daily., Disp: 10 g, Rfl: 12 .  meloxicam (MOBIC) 15 MG tablet, Take 1 tablet (15 mg total) by mouth daily., Disp: 90 tablet, Rfl: 0 .  methylPREDNISolone (MEDROL) 4 MG TBPK tablet, 6 day taper; take as directed on package instructions, Disp: 21 tablet, Rfl: 0 .  triamterene-hydrochlorothiazide (MAXZIDE-25) 37.5-25 MG tablet, Take 1 tablet by mouth daily., Disp: 90 tablet, Rfl: 3 .  valACYclovir (VALTREX) 500 MG tablet, Take 1 tablet (500 mg total) by mouth 2 (two) times daily., Disp: 14 tablet, Rfl: 0   Patient Care Team: Margaretann Loveless, PA-C as PCP - General (Family  Medicine) Malva Limes, MD as Referring Physician (Family Medicine) Earline Mayotte, MD (General Surgery)    Objective:    Vitals: BP (!) 169/90 (BP Location: Left Arm, Patient Position: Sitting, Cuff Size: Large)   Pulse 85   Temp 97.6 F (36.4 C) (Temporal)   Resp 16   Ht 5' 6.5" (1.689 m)   Wt 138 lb 8 oz (62.8 kg)   BMI 22.02 kg/m    Vitals:   03/18/19 1012  BP: (!) 169/90  Pulse: 85  Resp: 16  Temp: 97.6 F (36.4 C)  TempSrc: Temporal  Weight: 138 lb 8 oz (62.8 kg)  Height: 5' 6.5" (1.689 m)     Physical Exam Vitals reviewed. Exam conducted with a chaperone present.  Constitutional:      General: She is not in acute distress.    Appearance: Normal appearance. She is well-developed and normal weight. She is not ill-appearing or diaphoretic.  HENT:     Head: Normocephalic and atraumatic.     Right Ear: Hearing, tympanic membrane, ear canal and external ear normal.     Left Ear: Hearing, tympanic membrane, ear canal and external ear normal.  Eyes:     General: No scleral icterus.       Right eye: No discharge.        Left eye: No discharge.     Extraocular Movements: Extraocular movements intact.     Conjunctiva/sclera: Conjunctivae normal.     Pupils: Pupils are equal, round, and reactive to light.  Neck:     Thyroid: No thyromegaly.     Vascular: No carotid bruit or JVD.     Trachea: No tracheal deviation.  Cardiovascular:     Rate and Rhythm: Normal rate and regular rhythm.     Pulses: Normal pulses.     Heart sounds: Normal heart sounds. No murmur. No friction rub. No gallop.   Pulmonary:     Effort: Pulmonary effort is normal. No respiratory distress.     Breath sounds: Normal breath sounds. No wheezing or rales.  Chest:     Chest wall: No tenderness.     Breasts: Breasts are symmetrical.        Right: No inverted nipple, mass, nipple discharge, skin change or tenderness.        Left: No inverted nipple, mass, nipple discharge, skin change or  tenderness.  Abdominal:     General: Abdomen is flat. Bowel sounds are normal. There is no distension.     Palpations: Abdomen is soft. There is no mass.     Tenderness: There is no abdominal tenderness. There is no right CVA tenderness, left CVA tenderness, guarding or rebound.     Hernia: There is no hernia in the left inguinal area.  Genitourinary:    General: Normal vulva.     Exam position: Supine.     Labia:        Right: No rash, tenderness, lesion or injury.        Left: No rash, tenderness, lesion or injury.      Urethra: No prolapse, urethral pain, urethral swelling or urethral lesion.     Vagina: No signs of injury. Vaginal discharge (copious, thick, white discharge) present. No erythema, tenderness or bleeding.     Cervix: No cervical motion tenderness, discharge, friability, lesion or erythema.     Uterus: Normal.      Adnexa: Right adnexa normal and left adnexa normal.       Right: No mass, tenderness or fullness.         Left: No mass, tenderness or fullness.       Rectum: Normal.  Musculoskeletal:        General: No tenderness. Normal range of motion.     Cervical back: Normal range of motion and neck supple.     Right lower leg: No edema.     Left lower leg: No edema.  Lymphadenopathy:     Cervical: No cervical adenopathy.     Lower Body: No right inguinal adenopathy. No left inguinal adenopathy.  Skin:    General: Skin is warm and dry.     Capillary Refill: Capillary refill takes less than 2 seconds.     Findings: No rash.  Neurological:  General: No focal deficit present.     Mental Status: She is alert and oriented to person, place, and time. Mental status is at baseline.     Cranial Nerves: No cranial nerve deficit.     Coordination: Coordination normal.     Deep Tendon Reflexes: Reflexes are normal and symmetric.  Psychiatric:        Mood and Affect: Mood normal.        Behavior: Behavior normal.        Thought Content: Thought content normal.         Judgment: Judgment normal.      Depression Screen PHQ 2/9 Scores 03/18/2019 06/29/2018 06/22/2017 06/13/2016  PHQ - 2 Score 1 2 2  0  PHQ- 9 Score - 5 5 0       Assessment & Plan:     Routine Health Maintenance and Physical Exam  Exercise Activities and Dietary recommendations Goals    . Exercise 150 minutes per week (moderate activity)       Immunization History  Administered Date(s) Administered  . Td 06/29/2006, 06/13/2016  . Tdap 06/29/2006    Health Maintenance  Topic Date Due  . INFLUENZA VACCINE  05/01/2019 (Originally 09/01/2018)  . PAP SMEAR-Modifier  06/14/2019  . MAMMOGRAM  07/30/2020  . TETANUS/TDAP  06/14/2026  . COLONOSCOPY  11/10/2026  . HIV Screening  Completed     Discussed health benefits of physical activity, and encouraged her to engage in regular exercise appropriate for her age and condition.    1. Annual physical exam Normal physical exam today. Will check labs as below and f/u pending lab results. If labs are stable and WNL she will not need to have these rechecked for one year at her next annual physical exam. She is to call the office in the meantime if she has any acute issue, questions or concerns. - CBC with Differential/Platelet - Comprehensive metabolic panel - Hemoglobin A1c - Lipid panel - TSH  2. Encounter for breast cancer screening using non-mammogram modality Breast exam today was normal. There is no family history of breast cancer. She does perform regular self breast exams. Mammogram was ordered as below. Information for [facility] clinic was given to patient so she may schedule her mammogram at her convenience. - MM 3D SCREEN BREAST BILATERAL; Future  3. Cervical cancer screening Pap collected today. Will send as below and f/u pending results. - Cytology - PAP  4. Vitamin D deficiency H/O this. Will check labs as below and f/u pending results. - Vitamin D (25 hydroxy)  5. Vaginal discharge Noted on exam. Suspect BV.  Will treat with metronidazole as below. Pap was collected and STI screen also added to that test. Will f/u pending results.  - Cytology - PAP - metroNIDAZOLE (FLAGYL) 500 MG tablet; Take 1 tablet (500 mg total) by mouth 3 (three) times daily.  Dispense: 21 tablet; Refill: 0  6. BV (bacterial vaginosis) See above medical treatment plan. - metroNIDAZOLE (FLAGYL) 500 MG tablet; Take 1 tablet (500 mg total) by mouth 3 (three) times daily.  Dispense: 21 tablet; Refill: 0  --------------------------------------------------------------------    [facility], PA-C  [facility]

## 2019-03-18 NOTE — Patient Instructions (Signed)
Health Maintenance, Female Adopting a healthy lifestyle and getting preventive care are important in promoting health and wellness. Ask your health care provider about:  The right schedule for you to have regular tests and exams.  Things you can do on your own to prevent diseases and keep yourself healthy. What should I know about diet, weight, and exercise? Eat a healthy diet   Eat a diet that includes plenty of vegetables, fruits, low-fat dairy products, and lean protein.  Do not eat a lot of foods that are high in solid fats, added sugars, or sodium. Maintain a healthy weight Body mass index (BMI) is used to identify weight problems. It estimates body fat based on height and weight. Your health care provider can help determine your BMI and help you achieve or maintain a healthy weight. Get regular exercise Get regular exercise. This is one of the most important things you can do for your health. Most adults should:  Exercise for at least 150 minutes each week. The exercise should increase your heart rate and make you sweat (moderate-intensity exercise).  Do strengthening exercises at least twice a week. This is in addition to the moderate-intensity exercise.  Spend less time sitting. Even light physical activity can be beneficial. Watch cholesterol and blood lipids Have your blood tested for lipids and cholesterol at 53 years of age, then have this test every 5 years. Have your cholesterol levels checked more often if:  Your lipid or cholesterol levels are high.  You are older than 53 years of age.  You are at high risk for heart disease. What should I know about cancer screening? Depending on your health history and family history, you may need to have cancer screening at various ages. This may include screening for:  Breast cancer.  Cervical cancer.  Colorectal cancer.  Skin cancer.  Lung cancer. What should I know about heart disease, diabetes, and high blood  pressure? Blood pressure and heart disease  High blood pressure causes heart disease and increases the risk of stroke. This is more likely to develop in people who have high blood pressure readings, are of African descent, or are overweight.  Have your blood pressure checked: ? Every 3-5 years if you are 18-39 years of age. ? Every year if you are 40 years old or older. Diabetes Have regular diabetes screenings. This checks your fasting blood sugar level. Have the screening done:  Once every three years after age 40 if you are at a normal weight and have a low risk for diabetes.  More often and at a younger age if you are overweight or have a high risk for diabetes. What should I know about preventing infection? Hepatitis B If you have a higher risk for hepatitis B, you should be screened for this virus. Talk with your health care provider to find out if you are at risk for hepatitis B infection. Hepatitis C Testing is recommended for:  Everyone born from 1945 through 1965.  Anyone with known risk factors for hepatitis C. Sexually transmitted infections (STIs)  Get screened for STIs, including gonorrhea and chlamydia, if: ? You are sexually active and are younger than 53 years of age. ? You are older than 53 years of age and your health care provider tells you that you are at risk for this type of infection. ? Your sexual activity has changed since you were last screened, and you are at increased risk for chlamydia or gonorrhea. Ask your health care provider if   you are at risk.  Ask your health care provider about whether you are at high risk for HIV. Your health care provider may recommend a prescription medicine to help prevent HIV infection. If you choose to take medicine to prevent HIV, you should first get tested for HIV. You should then be tested every 3 months for as long as you are taking the medicine. Pregnancy  If you are about to stop having your period (premenopausal) and  you may become pregnant, seek counseling before you get pregnant.  Take 400 to 800 micrograms (mcg) of folic acid every day if you become pregnant.  Ask for birth control (contraception) if you want to prevent pregnancy. Osteoporosis and menopause Osteoporosis is a disease in which the bones lose minerals and strength with aging. This can result in bone fractures. If you are 65 years old or older, or if you are at risk for osteoporosis and fractures, ask your health care provider if you should:  Be screened for bone loss.  Take a calcium or vitamin D supplement to lower your risk of fractures.  Be given hormone replacement therapy (HRT) to treat symptoms of menopause. Follow these instructions at home: Lifestyle  Do not use any products that contain nicotine or tobacco, such as cigarettes, e-cigarettes, and chewing tobacco. If you need help quitting, ask your health care provider.  Do not use street drugs.  Do not share needles.  Ask your health care provider for help if you need support or information about quitting drugs. Alcohol use  Do not drink alcohol if: ? Your health care provider tells you not to drink. ? You are pregnant, may be pregnant, or are planning to become pregnant.  If you drink alcohol: ? Limit how much you use to 0-1 drink a day. ? Limit intake if you are breastfeeding.  Be aware of how much alcohol is in your drink. In the [place] one drink equals one 12 oz bottle of beer (355 mL), one 5 oz glass of wine (148 mL), or one 1 oz glass of hard liquor (44 mL). General instructions  Schedule regular health, dental, and eye exams.  Stay current with your vaccines.  Tell your health care provider if: ? You often feel depressed. ? You have ever been abused or do not feel safe at home. Summary  Adopting a healthy lifestyle and getting preventive care are important in promoting health and wellness.  Follow your health care provider's instructions about healthy  diet, exercising, and getting tested or screened for diseases.  Follow your health care provider's instructions on monitoring your cholesterol and blood pressure. This information is not intended to replace advice given to you by your health care provider. Make sure you discuss any questions you have with your health care provider. Document Revised: 01/10/2018 Document Reviewed: 01/10/2018 Elsevier Patient Education  2020 Elsevier Inc.  

## 2019-03-19 ENCOUNTER — Telehealth: Payer: Self-pay

## 2019-03-19 LAB — CBC WITH DIFFERENTIAL/PLATELET
Basophils Absolute: 0 10*3/uL (ref 0.0–0.2)
Basos: 1 %
EOS (ABSOLUTE): 0.1 10*3/uL (ref 0.0–0.4)
Eos: 2 %
Hematocrit: 36.2 % (ref 34.0–46.6)
Hemoglobin: 12.4 g/dL (ref 11.1–15.9)
Immature Grans (Abs): 0 10*3/uL (ref 0.0–0.1)
Immature Granulocytes: 0 %
Lymphocytes Absolute: 1.1 10*3/uL (ref 0.7–3.1)
Lymphs: 20 %
MCH: 30.9 pg (ref 26.6–33.0)
MCHC: 34.3 g/dL (ref 31.5–35.7)
MCV: 90 fL (ref 79–97)
Monocytes Absolute: 0.4 10*3/uL (ref 0.1–0.9)
Monocytes: 6 %
Neutrophils Absolute: 4 10*3/uL (ref 1.4–7.0)
Neutrophils: 71 %
Platelets: 242 10*3/uL (ref 150–450)
RBC: 4.01 x10E6/uL (ref 3.77–5.28)
RDW: 13.3 % (ref 11.7–15.4)
WBC: 5.6 10*3/uL (ref 3.4–10.8)

## 2019-03-19 LAB — LIPID PANEL
Chol/HDL Ratio: 2.7 ratio (ref 0.0–4.4)
Cholesterol, Total: 242 mg/dL — ABNORMAL HIGH (ref 100–199)
HDL: 90 mg/dL (ref >39)
LDL Chol Calc (NIH): 137 mg/dL — ABNORMAL HIGH (ref 0–99)
Triglycerides: 90 mg/dL (ref 0–149)
VLDL Cholesterol Cal: 15 mg/dL (ref 5–40)

## 2019-03-19 LAB — COMPREHENSIVE METABOLIC PANEL
ALT: 18 IU/L (ref 0–32)
AST: 28 IU/L (ref 0–40)
Albumin/Globulin Ratio: 1.7 (ref 1.2–2.2)
Albumin: 4.7 g/dL (ref 3.8–4.9)
Alkaline Phosphatase: 91 IU/L (ref 39–117)
BUN/Creatinine Ratio: 18 (ref 9–23)
BUN: 17 mg/dL (ref 6–24)
Bilirubin Total: 0.3 mg/dL (ref 0.0–1.2)
CO2: 24 mmol/L (ref 20–29)
Calcium: 9.6 mg/dL (ref 8.7–10.2)
Chloride: 101 mmol/L (ref 96–106)
Creatinine, Ser: 0.94 mg/dL (ref 0.57–1.00)
GFR calc Af Amer: 81 mL/min/{1.73_m2} (ref >59)
GFR calc non Af Amer: 70 mL/min/{1.73_m2} (ref >59)
Globulin, Total: 2.7 g/dL (ref 1.5–4.5)
Glucose: 82 mg/dL (ref 65–99)
Potassium: 4 mmol/L (ref 3.5–5.2)
Sodium: 140 mmol/L (ref 134–144)
Total Protein: 7.4 g/dL (ref 6.0–8.5)

## 2019-03-19 LAB — HEMOGLOBIN A1C
Est. average glucose Bld gHb Est-mCnc: 103 mg/dL
Hgb A1c MFr Bld: 5.2 % (ref 4.8–5.6)

## 2019-03-19 LAB — TSH: TSH: 2.12 u[IU]/mL (ref 0.450–4.500)

## 2019-03-19 LAB — VITAMIN D 25 HYDROXY (VIT D DEFICIENCY, FRACTURES): Vit D, 25-Hydroxy: 9.7 ng/mL — ABNORMAL LOW (ref 30.0–100.0)

## 2019-03-19 MED ORDER — VITAMIN D (ERGOCALCIFEROL) 1.25 MG (50000 UNIT) PO CAPS: 50000.0000 [IU] | ORAL_CAPSULE | ORAL | 1 refills | Status: AC

## 2019-03-19 NOTE — Telephone Encounter (Signed)
-----   Message from Margaretann Loveless, [place] sent at 03/19/2019 10:46 AM EST ----- Blood count is normal. Kidney and liver function is normal. Sugar/A1c is normal. Cholesterol has increased slightly compared to last year, but HDL (good) cholesterol has also increased to 90 and is cardioprotective. No need for cholesterol lowering medication at this time. Just continue healthy lifestyle modifications. Thyroid is normal. Vit D is quite low at 9.3. Would recommend high dose Vit D supplement to be taken once weekly x 6 months. Once completed, transition to OTC Vit D 1000-2000 IU daily. Pap still pending.

## 2019-03-19 NOTE — Addendum Note (Signed)
Addended by: Margaretann Loveless on: 03/19/2019 10:47 AM   Modules accepted: Orders

## 2019-03-19 NOTE — Telephone Encounter (Signed)
LMTCB or to view labs/providers message through my chart.

## 2019-03-20 ENCOUNTER — Telehealth: Payer: Self-pay

## 2019-03-20 LAB — CYTOLOGY - PAP
Chlamydia: NEGATIVE
Comment: NEGATIVE
Comment: NEGATIVE
Comment: NEGATIVE
Comment: NORMAL
Diagnosis: NEGATIVE
High risk HPV: NEGATIVE
Neisseria Gonorrhea: NEGATIVE
Trichomonas: NEGATIVE

## 2019-03-20 NOTE — Telephone Encounter (Signed)
Pt advised.   Thanks,   -Braydin Aloi  

## 2019-03-20 NOTE — Telephone Encounter (Signed)
Result Communications  Result Notes and Comments to Patient Comment seen by patient Emily Hurst on 03/19/2019 12:02 PM EST

## 2019-03-20 NOTE — Telephone Encounter (Signed)
-----   Message from Margaretann Loveless, [place] sent at 03/20/2019 12:23 PM EST ----- Pap is normal, HPV negative. Negative for gonorrhea, chlamydia and trichomonas.  Will repeat in 3-5 years.

## 2019-11-18 ENCOUNTER — Telehealth: Payer: Self-pay

## 2019-11-18 NOTE — Telephone Encounter (Signed)
Copied from CRM [id number]. Topic: General - Other >> Nov 18, 2019 10:11 AM Dalphine Handing A wrote: Patient is wanting to get mammogram ordered and scheduled. Please advise

## 2019-11-18 NOTE — Telephone Encounter (Signed)
Patient advised that mammogram ordered was placed when she was here for her physical And was given information below. [facility] [address] [place],  [place]  [address] Get Driving Directions Main: [phone]

## 2019-11-28 ENCOUNTER — Ambulatory Visit
Admission: RE | Admit: 2019-11-28 | Discharge: 2019-11-28 | Disposition: A | Discharge status: Home / Self Care | Payer: Managed Care, Other (non HMO) | Source: Ambulatory Visit | Attending: Physician Assistant | Admitting: Physician Assistant

## 2019-11-28 ENCOUNTER — Other Ambulatory Visit: Payer: Self-pay

## 2019-11-28 DIAGNOSIS — Z1231 Encounter for screening mammogram for malignant neoplasm of breast: Secondary | ICD-10-CM | POA: Insufficient documentation

## 2019-11-28 DIAGNOSIS — Z1239 Encounter for other screening for malignant neoplasm of breast: Secondary | ICD-10-CM

## 2020-03-02 ENCOUNTER — Encounter: Payer: Self-pay | Admitting: Physician Assistant

## 2020-03-02 ENCOUNTER — Telehealth (INDEPENDENT_AMBULATORY_CARE_PROVIDER_SITE_OTHER): Payer: Managed Care, Other (non HMO) | Admitting: Physician Assistant

## 2020-03-02 DIAGNOSIS — F320 Major depressive disorder, single episode, mild: Secondary | ICD-10-CM

## 2020-03-02 DIAGNOSIS — I10 Essential (primary) hypertension: Secondary | ICD-10-CM

## 2020-03-02 MED ORDER — ESCITALOPRAM OXALATE 10 MG PO TABS: ORAL_TABLET | ORAL | 1 refills | Status: DC

## 2020-03-02 MED ORDER — TRIAMTERENE-HCTZ 37.5-25 MG PO TABS: 1.0000 | ORAL_TABLET | Freq: Every day | ORAL | 3 refills | Status: DC

## 2020-03-02 NOTE — Progress Notes (Signed)
MyChart Video Visit    Virtual Visit via Video Note   This visit type was conducted due to national recommendations for restrictions regarding the COVID-19 Pandemic (e.g. social distancing) in an effort to limit this patient's exposure and mitigate transmission in our community. This patient is at least at moderate risk for complications without adequate follow up. This format is felt to be most appropriate for this patient at this time. Physical exam was limited by quality of the video and audio technology used for the visit.   Patient location: Home Provider location: [facility]  I discussed the limitations of evaluation and management by telemedicine and the availability of in person appointments. The patient expressed understanding and agreed to proceed.  [name]   DOB: [birth year]   55 y.o. Female  MRN: [id number] Visit Date: 03/02/2020  Today's healthcare provider: Margaretann Loveless, PA-C   Chief Complaint  Patient presents with  . Depression   Subjective    HPI  Depression: Patient complains of depression. She complains of depressed mood, difficulty concentrating, fatigue and insomnia. Onset was approximately several months ago, unchanged since that time.  She denies current suicidal and homicidal plan or intent.   Family history significant for alcoholism and Mental health on the maternal side and alcoholism in paternal side. She is abdopted but has learn this recently about the mental status.Possible organic causes contributing are: none, family events with her son. Her son got admitted due mental status. Per patient she has done counseling therapy before but never on treatment. She does have a therapy appt scheduled on 03/12/20.  Depression screen PHQ 2/9 03/02/2020  Decreased Interest 3  Down, Depressed, Hopeless 3  PHQ - 2 Score 6  Altered sleeping 3  Tired, decreased energy 3  Change in appetite 0  Feeling bad or failure about yourself   0  Trouble concentrating 2  Moving slowly or fidgety/restless 1  Suicidal thoughts 0  PHQ-9 Score 15  Difficult doing work/chores Somewhat difficult   GAD 7 : Generalized Anxiety Score 03/02/2020  Nervous, Anxious, on Edge 2  Control/stop worrying 3  Worry too much - different things 3  Trouble relaxing 3  Restless 1  Easily annoyed or irritable 2  Afraid - awful might happen 1  Total GAD 7 Score 15  Anxiety Difficulty Somewhat difficult     Patient Active Problem List   Diagnosis Date Noted  . Encounter for screening colonoscopy 09/09/2016  . Encounter for screening mammogram for breast cancer 10/28/2014  . Body water dehydration 09/29/2014  . Early menopause 09/29/2014  . Vitamin D deficiency 09/25/2014   Past Medical History:  Diagnosis Date  . Chicken pox    34  . Family history of adverse reaction to anesthesia    pt is adopted, does not know family medical history.  Marland Kitchen Heart murmur    diagnosed at age 87.  Marland Kitchen History of kidney stones 2006  . Hypertension    pt is not being treated for hypertension, it is being watched.  . Migraine   . Spinal headache       Medications: Outpatient Medications Prior to Visit  Medication Sig  . valACYclovir (VALTREX) 500 MG tablet Take 1 tablet (500 mg total) by mouth 2 (two) times daily.  Marland Kitchen aspirin-acetaminophen-caffeine (EXCEDRIN MIGRAINE) 250-250-65 MG per tablet Take 1 tablet by mouth every 8 (eight) hours as needed (for migraine/headaches.). AS NEEDED  . cyclobenzaprine (FLEXERIL) 5 MG tablet Take 1 tablet (  5 mg total) by mouth 3 (three) times daily as needed for muscle spasms. (Patient not taking: Reported on 03/02/2020)  . fluticasone (FLONASE SENSIMIST) 27.5 MCG/SPRAY nasal spray Place 2 sprays into the nose daily. (Patient not taking: Reported on 03/02/2020)  . meloxicam (MOBIC) 15 MG tablet Take 1 tablet (15 mg total) by mouth daily. (Patient not taking: Reported on 03/02/2020)  . methylPREDNISolone (MEDROL) 4 MG TBPK  tablet 6 day taper; take as directed on package instructions  . metroNIDAZOLE (FLAGYL) 500 MG tablet Take 1 tablet (500 mg total) by mouth 3 (three) times daily.  Marland Kitchen triamterene-hydrochlorothiazide (MAXZIDE-25) 37.5-25 MG tablet Take 1 tablet by mouth daily. (Patient not taking: Reported on 03/02/2020)  . Vitamin D, Ergocalciferol, (DRISDOL) 1.25 MG (50000 UNIT) CAPS capsule Take 1 capsule (50,000 Units total) by mouth every 7 (seven) days. (Patient not taking: Reported on 03/02/2020)   No facility-administered medications prior to visit.    Review of Systems  Constitutional: Negative.   Respiratory: Negative.   Cardiovascular: Negative.   Psychiatric/Behavioral: Positive for agitation, decreased concentration, dysphoric mood and sleep disturbance. The patient is nervous/anxious.     Last CBC Lab Results  Component Value Date   WBC 5.6 03/18/2019   HGB 12.4 03/18/2019   HCT 36.2 03/18/2019   MCV 90 03/18/2019   MCH 30.9 03/18/2019   RDW 13.3 03/18/2019   PLT 242 03/18/2019   Last metabolic panel Lab Results  Component Value Date   GLUCOSE 82 03/18/2019   NA 140 03/18/2019   K 4.0 03/18/2019   CL 101 03/18/2019   CO2 24 03/18/2019   BUN 17 03/18/2019   CREATININE 0.94 03/18/2019   GFRNONAA 70 03/18/2019   GFRAA 81 03/18/2019   CALCIUM 9.6 03/18/2019   PROT 7.4 03/18/2019   ALBUMIN 4.7 03/18/2019   LABGLOB 2.7 03/18/2019   AGRATIO 1.7 03/18/2019   BILITOT 0.3 03/18/2019   ALKPHOS 91 03/18/2019   AST 28 03/18/2019   ALT 18 03/18/2019      Objective    There were no vitals taken for this visit. BP Readings from Last 3 Encounters:  03/18/19 (!) 169/90  06/29/18 (!) 143/99  06/05/18 (!) 150/102   Wt Readings from Last 3 Encounters:  03/18/19 138 lb 8 oz (62.8 kg)  06/29/18 133 lb 12.8 oz (60.7 kg)  06/05/18 133 lb (60.3 kg)      Physical Exam Vitals reviewed.  Constitutional:      General: She is not in acute distress.    Appearance: Normal appearance. She  is well-developed, normal weight and well-nourished. She is not ill-appearing.  HENT:     Head: Normocephalic and atraumatic.  Eyes:     Extraocular Movements: EOM normal.  Pulmonary:     Effort: Pulmonary effort is normal. No respiratory distress.  Musculoskeletal:     Cervical back: Normal range of motion and neck supple.  Neurological:     Mental Status: She is alert.  Psychiatric:        Mood and Affect: Mood and affect and mood normal.        Behavior: Behavior normal.        Thought Content: Thought content normal.        Judgment: Judgment normal.       Assessment & Plan     1. Depression, major, single episode, mild (HCC) Worsening. Has many external stressors. Will start Lexapro as below. F/U in 4-6 weeks.  - escitalopram (LEXAPRO) 10 MG tablet; Start with 0.5  tab (5mg ) PO q hs x 1 week, then increase to 1 tab PO q hs  Dispense: 30 tablet; Refill: 1  2. Essential hypertension Stable. Diagnosis pulled for medication refill. Continue current medical treatment plan. - triamterene-hydrochlorothiazide (MAXZIDE-25) 37.5-25 MG tablet; Take 1 tablet by mouth daily.  Dispense: 90 tablet; Refill: 3   No follow-ups on file.     I discussed the assessment and treatment plan with the patient. The patient was provided an opportunity to ask questions and all were answered. The patient agreed with the plan and demonstrated an understanding of the instructions.   The patient was advised to call back or seek an in-person evaluation if the symptoms worsen or if the condition fails to improve as anticipated.  I provided 27 minutes of non-face-to-face time during this encounter.  11-07-1991, PA-C, have reviewed all documentation for this visit. The documentation on 03/02/20 for the exam, diagnosis, procedures, and orders are all accurate and complete.  03/04/20 [facility] [phone] (phone) [phone] (fax)  [facility]

## 2020-03-02 NOTE — Patient Instructions (Signed)
10 Relaxation Techniques That Zap Stress Fast By Jeannette Moninger   Listen  Relax. You deserve it, it's good for you, and it takes less time than you think. You don't need a spa weekend or a retreat. Each of these stress-relieving tips can get you from OMG to om in less than 15 minutes. 1. Meditate  A few minutes of practice per day can help ease anxiety. "Research suggests that daily meditation may alter the brain's neural pathways, making you more resilient to stress," says psychologist Robbie Maller Hartman, PhD, a [place] health and wellness coach. It's simple. Sit up straight with both feet on the floor. Close your eyes. Focus your attention on reciting -- out loud or silently -- a positive mantra such as "I feel at peace" or "I love myself." Place one hand on your belly to sync the mantra with your breaths. Let any distracting thoughts float by like clouds. 2. Breathe Deeply  Take a 5-minute break and focus on your breathing. Sit up straight, eyes closed, with a hand on your belly. Slowly inhale through your nose, feeling the breath start in your abdomen and work its way to the top of your head. Reverse the process as you exhale through your mouth.  "Deep breathing counters the effects of stress by slowing the heart rate and lowering blood pressure," psychologist Judith Tutin, PhD, says. She's a certified life coach in [place], [place] 3. Be Present  Slow down.  "Take 5 minutes and focus on only one behavior with awareness," Tutin says. Notice how the air feels on your face when you're walking and how your feet feel hitting the ground. Enjoy the texture and taste of each bite of food. When you spend time in the moment and focus on your senses, you should feel less tense. 4. Reach Out  Your social network is one of your best tools for handling stress. Talk to others -- preferably face to face, or at least on the phone. Share what's going on. You can get a fresh perspective while keeping your  connection strong. 5. Tune In to Your Body  Mentally scan your body to get a sense of how stress affects it each day. Lie on your back, or sit with your feet on the floor. Start at your toes and work your way up to your scalp, noticing how your body feels.  10 Relaxation Techniques That Zap Stress Fast By Jeannette Moninger   Listen  "Simply be aware of places you feel tight or loose without trying to change anything," Tutin says. For 1 to 2 minutes, imagine each deep breath flowing to that body part. Repeat this process as you move your focus up your body, paying close attention to sensations you feel in each body part. 6. Decompress  Place a warm heat wrap around your neck and shoulders for 10 minutes. Close your eyes and relax your face, neck, upper chest, and back muscles. Remove the wrap, and use a tennis ball or foam roller to massage away tension.  "Place the ball between your back and the wall. Lean into the ball, and hold gentle pressure for up to 15 seconds. Then move the ball to another spot, and apply pressure," says Cathy Benninger, a nurse practitioner and assistant professor at [employer] [facility] in [place]. 7. Laugh Out Loud  A good belly laugh doesn't just lighten the load mentally. It lowers cortisol, your body's stress hormone, and boosts brain chemicals called endorphins, which help   your mood. Lighten up by tuning in to your favorite sitcom or video, reading the comics, or chatting with someone who makes you smile. 8. Crank Up the Tunes  Research shows that listening to soothing music can lower blood pressure, heart rate, and anxiety. "Create a playlist of songs or nature sounds (the ocean, a bubbling brook, birds chirping), and allow your mind to focus on the different melodies, instruments, or singers in the piece," Benninger says. You also can blow off steam by rocking out to more upbeat tunes -- or singing at the top of your lungs! 9. Get Moving   You don't have to run in order to get a runner's high. All forms of exercise, including yoga and walking, can ease depression and anxiety by helping the brain release feel-good chemicals and by giving your body a chance to practice dealing with stress. You can go for a quick walk around the block, take the stairs up and down a few flights, or do some stretching exercises like head rolls and shoulder shrugs. 10. Be Grateful  Keep a gratitude journal or several (one by your bed, one in your purse, and one at work) to help you remember all the things that are good in your life.  "Being grateful for your blessings cancels out negative thoughts and worries," says Joni Emmerling, a wellness coach in [place], [place].  Use these journals to savor good experiences like a child's smile, a sunshine-filled day, and good health. Don't forget to celebrate accomplishments like mastering a new task at work or a new hobby. When you start feeling stressed, spend a few minutes looking through your notes to remind yourself what really matters.  Escitalopram Tablets What is this medicine? ESCITALOPRAM (es sye TAL oh pram) is used to treat depression and certain types of anxiety. This medicine may be used for other purposes; ask your health care provider or pharmacist if you have questions. COMMON BRAND NAME(S): Lexapro What should I tell my health care provider before I take this medicine? They need to know if you have any of these conditions:  bipolar disorder or a family history of bipolar disorder  diabetes  glaucoma  heart disease  kidney or liver disease  receiving electroconvulsive therapy  seizures (convulsions)  suicidal thoughts, plans, or attempt by you or a family member  an unusual or allergic reaction to escitalopram, the related drug citalopram, other medicines, foods, dyes, or preservatives  pregnant or trying to become pregnant  breast-feeding How should I use this medicine? Take  this medicine by mouth with a glass of water. Follow the directions on the prescription label. You can take it with or without food. If it upsets your stomach, take it with food. Take your medicine at regular intervals. Do not take it more often than directed. Do not stop taking this medicine suddenly except upon the advice of your doctor. Stopping this medicine too quickly may cause serious side effects or your condition may worsen. A special MedGuide will be given to you by the pharmacist with each prescription and refill. Be sure to read this information carefully each time. Talk to your pediatrician regarding the use of this medicine in children. Special care may be needed. Overdosage: If you think you have taken too much of this medicine contact a poison control center or emergency room at once. NOTE: This medicine is only for you. Do not share this medicine with others. What if I miss a dose? If you miss a dose, take  it as soon as you can. If it is almost time for your next dose, take only that dose. Do not take double or extra doses. What may interact with this medicine? Do not take this medicine with any of the following medications:  certain medicines for fungal infections like fluconazole, itraconazole, ketoconazole, posaconazole, voriconazole  cisapride  citalopram  dronedarone  linezolid  MAOIs like Carbex, Eldepryl, Marplan, Nardil, and Parnate  methylene blue (injected into a vein)  pimozide  thioridazine This medicine may also interact with the following medications:  alcohol  amphetamines  aspirin and aspirin-like medicines  carbamazepine  certain medicines for depression, anxiety, or psychotic disturbances  certain medicines for migraine headache like almotriptan, eletriptan, frovatriptan, naratriptan, rizatriptan, sumatriptan, zolmitriptan  certain medicines for sleep  certain medicines that treat or prevent blood clots like warfarin, enoxaparin,  dalteparin  cimetidine  diuretics  dofetilide  fentanyl  furazolidone  isoniazid  lithium  metoprolol  NSAIDs, medicines for pain and inflammation, like ibuprofen or naproxen  other medicines that prolong the QT interval (cause an abnormal heart rhythm)  procarbazine  rasagiline  supplements like St. John's wort, kava kava, valerian  tramadol  tryptophan  ziprasidone This list may not describe all possible interactions. Give your health care provider a list of all the medicines, herbs, non-prescription drugs, or dietary supplements you use. Also tell them if you smoke, drink alcohol, or use illegal drugs. Some items may interact with your medicine. What should I watch for while using this medicine? Tell your doctor if your symptoms do not get better or if they get worse. Visit your doctor or health care professional for regular checks on your progress. Because it may take several weeks to see the full effects of this medicine, it is important to continue your treatment as prescribed by your doctor. Patients and their families should watch out for new or worsening thoughts of suicide or depression. Also watch out for sudden changes in feelings such as feeling anxious, agitated, panicky, irritable, hostile, aggressive, impulsive, severely restless, overly excited and hyperactive, or not being able to sleep. If this happens, especially at the beginning of treatment or after a change in dose, call your health care professional. Bonita Quin may get drowsy or dizzy. Do not drive, use machinery, or do anything that needs mental alertness until you know how this medicine affects you. Do not stand or sit up quickly, especially if you are an older patient. This reduces the risk of dizzy or fainting spells. Alcohol may interfere with the effect of this medicine. Avoid alcoholic drinks. Your mouth may get dry. Chewing sugarless gum or sucking hard candy, and drinking plenty of water may help.  Contact your doctor if the problem does not go away or is severe. What side effects may I notice from receiving this medicine? Side effects that you should report to your doctor or health care professional as soon as possible:  allergic reactions like skin rash, itching or hives, swelling of the face, lips, or tongue  anxious  black, tarry stools  changes in vision  confusion  elevated mood, decreased need for sleep, racing thoughts, impulsive behavior  eye pain  fast, irregular heartbeat  feeling faint or lightheaded, falls  feeling agitated, angry, or irritable  hallucination, loss of contact with reality  loss of balance or coordination  loss of memory  painful or prolonged erections  restlessness, pacing, inability to keep still  seizures  stiff muscles  suicidal thoughts or other mood changes  trouble sleeping  unusual bleeding or bruising  unusually weak or tired  vomiting Side effects that usually do not require medical attention (report to your doctor or health care professional if they continue or are bothersome):  changes in appetite  change in sex drive or performance  headache  increased sweating  indigestion, nausea  tremors This list may not describe all possible side effects. Call your doctor for medical advice about side effects. You may report side effects to FDA at 1-800-FDA-1088. Where should I keep my medicine? Keep out of reach of children. Store at room temperature between 15 and 30 degrees C (59 and 86 degrees F). Throw away any unused medicine after the expiration date. NOTE: This sheet is a summary. It may not cover all possible information. If you have questions about this medicine, talk to your doctor, pharmacist, or health care provider.  2021 Elsevier/Gold Standard (2019-12-09 09:53:34)

## 2020-03-09 ENCOUNTER — Encounter: Payer: Self-pay | Admitting: Physician Assistant

## 2020-06-04 ENCOUNTER — Other Ambulatory Visit: Payer: Self-pay | Admitting: Physician Assistant

## 2020-06-04 DIAGNOSIS — F320 Major depressive disorder, single episode, mild: Secondary | ICD-10-CM

## 2020-06-04 NOTE — Telephone Encounter (Signed)
Requested medication (s) are due for refill today: yes  Requested medication (s) are on the active medication list: yes  Last refill:  05/05/2020  Future visit scheduled: no  Notes to clinic:  Patient was due to follow up in 4 to 6 weeks on start of this medication  Review for refill    Requested Prescriptions  Pending Prescriptions Disp Refills   escitalopram (LEXAPRO) 10 MG tablet [Pharmacy Med Name: ESCITALOPRAM 10 MG TABLET] 30 tablet 1    Sig: START WITH TAKING 1/2 TABLET (5MG ) BY MOUTH AT BEDTIME FOR 1 WEEK, THEN INCREASE TO 1 TABLET AT BEDTIME THEREAFTER      Psychiatry:  Antidepressants - SSRI Passed - 06/04/2020  2:11 AM      Passed - Valid encounter within last 6 months    Recent Outpatient Visits           3 months ago Depression, major, single episode, mild [facility])   [facility] [place], [place], PA-C   1 year ago Annual physical exam   [facility] [place], [place], Alessandra Bevels   1 year ago Herpes simplex of eyelid   [place], [employer], PA-C   1 year ago Encounter for annual physical exam   [facility], [employer], Alessandra Bevels   2 years ago Other chest pain   [facility] [place], [place], [place]

## 2020-06-23 ENCOUNTER — Ambulatory Visit: Payer: Self-pay

## 2020-06-23 DIAGNOSIS — I10 Essential (primary) hypertension: Secondary | ICD-10-CM

## 2020-06-23 MED ORDER — TRIAMTERENE-HCTZ 37.5-25 MG PO TABS: 1.0000 | ORAL_TABLET | Freq: Every day | ORAL | 0 refills | Status: AC

## 2020-06-23 NOTE — Telephone Encounter (Signed)
Pt. Reports she is staying in [place] and will find a PCP there. If there are recommendations, she would appreciate it. She also has been out of her Maxzide x several months and BP today is 165/98.No symptoms other than fatigue.Requests a refill be sent to [facility].Please advise pt.  Answer Assessment - Initial Assessment Questions 1. BLOOD PRESSURE: "What is the blood pressure?" "Did you take at least two measurements 5 minutes apart?"     164/104   165/98 2. ONSET: "When did you take your blood pressure?"     Yesterday 3. HOW: "How did you obtain the blood pressure?" (e.g., visiting nurse, automatic home BP monitor)     Home cuff 4. HISTORY: "Do you have a history of high blood pressure?"     Yes 5. MEDICATIONS: "Are you taking any medications for blood pressure?" "Have you missed any doses recently?"     Stopped medication several months 6. OTHER SYMPTOMS: "Do you have any symptoms?" (e.g., headache, chest pain, blurred vision, difficulty breathing, weakness)     Feels tired 7. PREGNANCY: "Is there any chance you are pregnant?" "When was your last menstrual period?"     No  Protocols used: BLOOD PRESSURE - HIGH-A-AH

## 2020-06-23 NOTE — Telephone Encounter (Signed)
Ok to give 30 day supply of maxzide

## 2020-07-15 ENCOUNTER — Ambulatory Visit: Payer: Self-pay | Admitting: *Deleted

## 2020-07-15 NOTE — Telephone Encounter (Signed)
Reason for Disposition  [1] MILD dizziness (e.g., vertigo; walking normally) AND [2] has NOT been evaluated by physician for this  Answer Assessment - Initial Assessment Questions 1. DESCRIPTION: "Describe your dizziness."     lightheaded 2. VERTIGO: "Do you feel like either you or the room is spinning or tilting?"      no 3. LIGHTHEADED: "Do you feel lightheaded?" (e.g., somewhat faint, woozy, weak upon standing)     Weak standing  4. SEVERITY: "How bad is it?"  "Can you walk?"   - MILD: Feels slightly dizzy and unsteady, but is walking normally.   - MODERATE: Feels unsteady when walking, but not falling; interferes with normal activities (e.g., school, work).   - SEVERE: Unable to walk without falling, or requires assistance to walk without falling.     Mild  5. ONSET:  "When did the dizziness begin?"     Friday 07/10/20 6. AGGRAVATING FACTORS: "Does anything make it worse?" (e.g., standing, change in head position)     Changing position of head 7. CAUSE: "What do you think is causing the dizziness?"     BP medication maxide  8. RECURRENT SYMPTOM: "Have you had dizziness before?" If Yes, ask: "When was the last time?" "What happened that time?"     na 9. OTHER SYMPTOMS: "Do you have any other symptoms?" (e.g., headache, weakness, numbness, vomiting, earache)     No  10. PREGNANCY: "Is there any chance you are pregnant?" "When was your last menstrual period?"       na  Protocols used: Dizziness - Vertigo-A-AH

## 2020-07-15 NOTE — Telephone Encounter (Signed)
C/o dizziness / lightheaded on and off since Friday 07/10/20 at outdoor concert. Patient reports she did not feel good over the weekend with dizziness and took covid test results negative Monday . Suspecting B/P medication may need adjusting. Denies chest pain, difficulty breathing. No N/T or weakness on either side. B/P sitting 138/89 patient was up walking with in 5 minutes of checking B/P. Standing B/P 113/86. Denies lightheadedness. Last seen by Marguerite Olea, PA. Patient currently in [place] and would like to set up appt sooner than August if possible with any provider. Care advise given. Patient verbalized understanding of care advise and to call back or go to [facility] or ED if symptoms worsen.

## 2020-07-16 NOTE — Telephone Encounter (Signed)
Spoke with patient on the phone who reports intermittent episodes of dizziness that has been occurring over the weekend, patient states that she has been keeping track of her blood pressure readings and has no associated symptoms such as nausea, visual changes, vomiting, vertigo. I advised patient that I would be able to schedule an appointemnt for her at [facility] if she did not want to go to urgent care. Patient states that she lives in [place] and states that she would come to [place] to see Daiva Nakayama PA on occasion. Patient states that she has not established with a new PCP in [place]. Patient declined making appointment in tomorrow since she is hours away. I encouraged patient to keep log of blood pressure readings and to seek care at urgent care this weekend in [place] if dizzy episodes continued. KW

## 2020-07-21 ENCOUNTER — Telehealth: Payer: Self-pay

## 2020-07-21 NOTE — Telephone Encounter (Signed)
Pt. Calling again in regard to dizziness and BP. States she did not go to UC for her dizziness as instructed. Still living in [place]. Has started taking 1/2 of Maxzide daily. BP 11/73 and 119/77. Dizziness comes and goes, mainly when standing. States cannot come to the office - "having car problems." Requests a medication change. Please advise.

## 2020-07-21 NOTE — Telephone Encounter (Signed)
This is something that needs eval. Those BPs are not bad. Would not be able to adjust meds without eval.

## 2020-07-23 NOTE — Telephone Encounter (Signed)
Patient advised.
